# Patient Record
Sex: Female | Born: 1960 | Race: White | Hispanic: No | Marital: Married | Smoking: Former smoker
Health system: Southern US, Community
[De-identification: ages and names within clinical notes are randomized; demographics above are authoritative.]

## PROBLEM LIST (undated history)

## (undated) HISTORY — PX: APPENDECTOMY: SHX54

## (undated) HISTORY — PX: ENDOMETRIAL ABLATION: SHX621

## (undated) HISTORY — PX: TONSILLECTOMY: SUR1361

## (undated) HISTORY — PX: OTHER SURGICAL HISTORY: SHX169

## (undated) HISTORY — PX: CHOLECYSTECTOMY: SHX55

## (undated) HISTORY — PX: LAPAROTOMY: SHX154

---

## 1997-10-06 ENCOUNTER — Ambulatory Visit (HOSPITAL_COMMUNITY): Admission: RE | Admit: 1997-10-06 | Discharge: 1997-10-06 | Payer: Self-pay | Admitting: Gastroenterology

## 1999-08-07 ENCOUNTER — Other Ambulatory Visit: Admission: RE | Admit: 1999-08-07 | Discharge: 1999-08-07 | Payer: Self-pay | Admitting: Obstetrics and Gynecology

## 1999-12-08 ENCOUNTER — Encounter (INDEPENDENT_AMBULATORY_CARE_PROVIDER_SITE_OTHER): Payer: Self-pay | Admitting: Specialist

## 1999-12-08 ENCOUNTER — Ambulatory Visit (HOSPITAL_COMMUNITY): Admission: RE | Admit: 1999-12-08 | Discharge: 1999-12-08 | Payer: Self-pay | Admitting: Family Medicine

## 2000-09-10 ENCOUNTER — Other Ambulatory Visit: Admission: RE | Admit: 2000-09-10 | Discharge: 2000-09-10 | Payer: Self-pay | Admitting: Obstetrics and Gynecology

## 2000-10-01 ENCOUNTER — Encounter: Payer: Self-pay | Admitting: Obstetrics and Gynecology

## 2000-10-01 ENCOUNTER — Ambulatory Visit (HOSPITAL_COMMUNITY): Admission: RE | Admit: 2000-10-01 | Discharge: 2000-10-01 | Payer: Self-pay | Admitting: Obstetrics and Gynecology

## 2001-08-15 ENCOUNTER — Inpatient Hospital Stay (HOSPITAL_COMMUNITY): Admission: AD | Admit: 2001-08-15 | Discharge: 2001-08-15 | Payer: Self-pay | Admitting: Obstetrics and Gynecology

## 2001-09-25 ENCOUNTER — Other Ambulatory Visit: Admission: RE | Admit: 2001-09-25 | Discharge: 2001-09-25 | Payer: Self-pay | Admitting: Obstetrics and Gynecology

## 2002-04-25 ENCOUNTER — Inpatient Hospital Stay (HOSPITAL_COMMUNITY): Admission: AD | Admit: 2002-04-25 | Discharge: 2002-04-30 | Payer: Self-pay | Admitting: Obstetrics and Gynecology

## 2002-05-01 ENCOUNTER — Encounter: Admission: RE | Admit: 2002-05-01 | Discharge: 2002-05-31 | Payer: Self-pay | Admitting: Obstetrics and Gynecology

## 2002-06-01 ENCOUNTER — Encounter: Admission: RE | Admit: 2002-06-01 | Discharge: 2002-07-01 | Payer: Self-pay | Admitting: Obstetrics and Gynecology

## 2002-06-15 ENCOUNTER — Other Ambulatory Visit: Admission: RE | Admit: 2002-06-15 | Discharge: 2002-06-15 | Payer: Self-pay | Admitting: Obstetrics and Gynecology

## 2003-06-28 ENCOUNTER — Other Ambulatory Visit: Admission: RE | Admit: 2003-06-28 | Discharge: 2003-06-28 | Payer: Self-pay | Admitting: Obstetrics and Gynecology

## 2004-09-29 ENCOUNTER — Other Ambulatory Visit: Admission: RE | Admit: 2004-09-29 | Discharge: 2004-09-29 | Payer: Self-pay | Admitting: Obstetrics and Gynecology

## 2005-01-08 ENCOUNTER — Ambulatory Visit (HOSPITAL_COMMUNITY): Admission: RE | Admit: 2005-01-08 | Discharge: 2005-01-08 | Payer: Self-pay | Admitting: Gastroenterology

## 2005-01-08 ENCOUNTER — Encounter (INDEPENDENT_AMBULATORY_CARE_PROVIDER_SITE_OTHER): Payer: Self-pay | Admitting: *Deleted

## 2005-01-09 ENCOUNTER — Ambulatory Visit (HOSPITAL_COMMUNITY): Admission: RE | Admit: 2005-01-09 | Discharge: 2005-01-09 | Payer: Self-pay | Admitting: Gastroenterology

## 2005-12-28 ENCOUNTER — Ambulatory Visit (HOSPITAL_COMMUNITY): Admission: RE | Admit: 2005-12-28 | Discharge: 2005-12-28 | Payer: Self-pay | Admitting: Gastroenterology

## 2005-12-28 ENCOUNTER — Encounter (INDEPENDENT_AMBULATORY_CARE_PROVIDER_SITE_OTHER): Payer: Self-pay | Admitting: Specialist

## 2006-01-07 ENCOUNTER — Ambulatory Visit (HOSPITAL_COMMUNITY): Admission: RE | Admit: 2006-01-07 | Discharge: 2006-01-07 | Payer: Self-pay | Admitting: Gastroenterology

## 2006-01-30 ENCOUNTER — Ambulatory Visit (HOSPITAL_COMMUNITY): Admission: RE | Admit: 2006-01-30 | Discharge: 2006-01-30 | Payer: Self-pay | Admitting: General Surgery

## 2006-03-07 ENCOUNTER — Ambulatory Visit (HOSPITAL_COMMUNITY): Admission: RE | Admit: 2006-03-07 | Discharge: 2006-03-08 | Payer: Self-pay | Admitting: General Surgery

## 2006-03-07 ENCOUNTER — Encounter (INDEPENDENT_AMBULATORY_CARE_PROVIDER_SITE_OTHER): Payer: Self-pay | Admitting: Specialist

## 2006-07-24 ENCOUNTER — Encounter: Payer: Self-pay | Admitting: Emergency Medicine

## 2006-07-25 ENCOUNTER — Inpatient Hospital Stay (HOSPITAL_COMMUNITY): Admission: AD | Admit: 2006-07-25 | Discharge: 2006-07-26 | Payer: Self-pay | Admitting: Obstetrics and Gynecology

## 2006-07-25 ENCOUNTER — Encounter (INDEPENDENT_AMBULATORY_CARE_PROVIDER_SITE_OTHER): Payer: Self-pay | Admitting: Specialist

## 2006-08-01 ENCOUNTER — Inpatient Hospital Stay (HOSPITAL_COMMUNITY): Admission: AD | Admit: 2006-08-01 | Discharge: 2006-08-02 | Payer: Self-pay | Admitting: Obstetrics and Gynecology

## 2007-02-05 ENCOUNTER — Encounter (INDEPENDENT_AMBULATORY_CARE_PROVIDER_SITE_OTHER): Payer: Self-pay | Admitting: Gastroenterology

## 2007-02-05 ENCOUNTER — Ambulatory Visit (HOSPITAL_COMMUNITY): Admission: RE | Admit: 2007-02-05 | Discharge: 2007-02-05 | Payer: Self-pay | Admitting: Gastroenterology

## 2007-04-30 ENCOUNTER — Ambulatory Visit: Payer: Self-pay | Admitting: Psychology

## 2007-05-13 ENCOUNTER — Ambulatory Visit: Payer: Self-pay | Admitting: Psychology

## 2007-05-17 ENCOUNTER — Emergency Department (HOSPITAL_COMMUNITY): Admission: EM | Admit: 2007-05-17 | Discharge: 2007-05-17 | Payer: Self-pay | Admitting: Family Medicine

## 2007-05-19 ENCOUNTER — Ambulatory Visit: Payer: Self-pay | Admitting: Psychology

## 2007-05-25 ENCOUNTER — Emergency Department (HOSPITAL_COMMUNITY): Admission: EM | Admit: 2007-05-25 | Discharge: 2007-05-25 | Payer: Self-pay | Admitting: Emergency Medicine

## 2007-05-30 ENCOUNTER — Ambulatory Visit: Payer: Self-pay | Admitting: Psychology

## 2007-05-31 ENCOUNTER — Ambulatory Visit (HOSPITAL_COMMUNITY): Admission: RE | Admit: 2007-05-31 | Discharge: 2007-05-31 | Payer: Self-pay | Admitting: Orthopaedic Surgery

## 2007-06-17 ENCOUNTER — Ambulatory Visit (HOSPITAL_BASED_OUTPATIENT_CLINIC_OR_DEPARTMENT_OTHER): Admission: RE | Admit: 2007-06-17 | Discharge: 2007-06-17 | Payer: Self-pay | Admitting: Orthopaedic Surgery

## 2007-06-26 ENCOUNTER — Ambulatory Visit: Payer: Self-pay | Admitting: Psychology

## 2007-08-13 ENCOUNTER — Encounter (INDEPENDENT_AMBULATORY_CARE_PROVIDER_SITE_OTHER): Payer: Self-pay | Admitting: Obstetrics and Gynecology

## 2007-08-14 ENCOUNTER — Ambulatory Visit (HOSPITAL_COMMUNITY): Admission: RE | Admit: 2007-08-14 | Discharge: 2007-08-14 | Payer: Self-pay | Admitting: Obstetrics and Gynecology

## 2007-08-21 ENCOUNTER — Ambulatory Visit: Payer: Self-pay | Admitting: Psychology

## 2007-11-30 ENCOUNTER — Emergency Department (HOSPITAL_COMMUNITY): Admission: EM | Admit: 2007-11-30 | Discharge: 2007-11-30 | Payer: Self-pay | Admitting: Family Medicine

## 2007-12-31 ENCOUNTER — Encounter: Admission: RE | Admit: 2007-12-31 | Discharge: 2007-12-31 | Payer: Self-pay | Admitting: Obstetrics and Gynecology

## 2008-01-11 ENCOUNTER — Encounter: Admission: RE | Admit: 2008-01-11 | Discharge: 2008-01-11 | Payer: Self-pay | Admitting: Obstetrics and Gynecology

## 2008-01-13 ENCOUNTER — Ambulatory Visit: Payer: Self-pay | Admitting: Psychology

## 2008-01-23 ENCOUNTER — Ambulatory Visit: Payer: Self-pay | Admitting: Psychology

## 2008-02-10 ENCOUNTER — Ambulatory Visit: Payer: Self-pay | Admitting: Psychology

## 2008-08-10 ENCOUNTER — Encounter: Admission: RE | Admit: 2008-08-10 | Discharge: 2008-08-10 | Payer: Self-pay | Admitting: Endocrinology

## 2010-09-05 NOTE — Op Note (Signed)
NAMEKARLIAH, [name]             ACCOUNT NO.:  [id number]   MEDICAL RECORD NO.:  [id number]          PATIENT TYPE:  AMB   LOCATION:  ENDO                         FACILITY:  [facility]   PHYSICIAN:  Anselmo Rod, M.D.  DATE OF BIRTH:  [birth year]   DATE OF PROCEDURE:  02/05/2007  DATE OF DISCHARGE:                               OPERATIVE REPORT   PROCEDURE PERFORMED:  Colonoscopy with rectal biopsies x 5.   ENDOSCOPIST:  Anselmo Rod, M.D.   INSTRUMENT USED:  Pentax video colonoscope.   INDICATIONS FOR PROCEDURE:  50 year old white female with a history of  ulcerative colitis and recent rectal bleeding undergoing a repeat  colonoscopy to evaluate the extent of disease.   PREPROCEDURE PREPARATION:  Informed consent was procured from the  patient.  The patient fasted for 4 hours prior to the procedure after  being prepped with 32 Osmo prep pills the night prior to the procedure.  The risks and benefits of the procedure including a 10% miss rate of  cancer and polyps were discussed with the patient, as well.   PREPROCEDURE PHYSICAL:  The patient had stable vital signs.  Neck  supple.  Chest clear to auscultation.  S1 and S2 regular.  Abdomen soft  with normal bowel sounds.   DESCRIPTION OF PROCEDURE:  The patient was placed in the left lateral  decubitus position and sedated with 100 mcg of fentanyl and 10 mg of  Versed given intravenously in slow incremental doses.  Once the patient  was adequately sedated and maintained on low flow oxygen and continuous  cardiac monitoring, the Pentax video colonoscope was advanced from the  rectum to cecum.  There was a significant amount of residual stool in  the colon.  Multiple washes were done.  Small lesions could have been  missed.  Mild inflammatory changes were noted from 0 to 8 cm involving  the rectum.  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 The rest of the colonic mucosa beyond 8 cm appeared normal.  The colonic mucosa had a healthy vascular pattern.  The appendiceal  orifice and ileocecal valve were clearly visualized and photographed.  The terminal ileum appeared healthy without lesions.  No other masses or  polyps were seen.  There was no evidence of diverticulosis.  The patient  tolerated the procedure well without any immediate complications.   IMPRESSION:  1. Mild inflammatory changes in the rectum from 0 to 8 cm.  2. Biopsies done to confirm the prior diagnosis of ulcerative      proctitis.  3. Normal colonic mucosa beyond 8 cm of the terminal ileum.   RECOMMENDATIONS:  1. Await pathology results.  2. Avoid all nonsteroidals including aspirin for now.  3. Discontinue Lialda.  4. Use Canasa suppositories 1000 mg p.o. q.h.s.  5. Outpatient follow up in the next three months for re-evaluation of      the patient's symptoms. If the patient has any abnormal symptoms      prior to that, she should contact the office for an earlier      appointment.  Anselmo Rod, M.D.  Electronically Signed     JNM/MEDQ  D:  02/05/2007  T:  02/06/2007  Job:  [id number]   cc:   Thora Lance, M.D.  Fax: [phone]

## 2010-09-05 NOTE — Op Note (Signed)
[name], [name]             ACCOUNT NO.:  [id number]   MEDICAL RECORD NO.:  [id number]          PATIENT TYPE:  AMB   LOCATION:  SDC                           FACILITY:  [facility]   PHYSICIAN:  Guy Sandifer. Henderson Cloud, M.D. DATE OF BIRTH:  [birth year]   DATE OF PROCEDURE:  08/14/2007  DATE OF DISCHARGE:                               OPERATIVE REPORT   PREOPERATIVE DIAGNOSIS:  1. Menorrhagia.  2. Desires permanent sterilization.   POSTOPERATIVE DIAGNOSES:  1. Menorrhagia.  2. Desires permanent sterilization.   PROCEDURE:  NovaSure endometrial ablation, hysteroscopy, dilatation and  curettage, laparoscopy with tubal ligation with Filshie clip  application, and ablation of endometriosis, and paracervical block.   SURGEON:  Guy Sandifer. Henderson Cloud, M.D.   ANESTHESIA:  General with endotracheal intubation.   SPECIMENS:  Endometrial curettings to pathology.   ESTIMATED BLOOD LOSS:  Minimal.   I&OS OF DISTENDING MEDIA:  145 mL deficit, with most of that being on  the floor.   INDICATIONS AND CONSENT:  This patient is a 50 year old married white  female, G1, P1, with increasingly heavy menses.  Details are dictated in  the history and physical.  Hysteroscopy D&C, endometrial ablation,  laparoscopy with tubal ligation have been discussed preoperatively.  Potential risks and complications were discussed preoperatively,  including but not limited to infection, bleeding requiring transfusion  of blood products, with HIV and hepatitis acquisition, uterine  perforation, organ damage, DVT, PE, and pneumonia.  Permanence, failure  rate, and increased ectopic risk of tubal ligation, as well as success  and failure rate of ablation, have also been reviewed.  All questions  were answered, and consent is signed on the chart.   FINDINGS:  The endometrial cavity is without abnormal structure.  Fallopian tube ostia are identified bilaterally.  Abdominally, upper  abdomen is grossly normal.  Appendix has  been removed.  Uterus is about  6 weeks in size, smooth in contour.  Tubes and ovaries are normal  bilaterally.  There is a white puckered implant of endometriosis on the  pelvic sidewalls bilaterally, well above the course of the ureters.  There is a single white-black spot of endometriosis in the left-hand  side of the posterior cul-de-sac.   PROCEDURE:  The patient is taken to the operating room, where she is  identified, placed in dorsal supine position, and general anesthesia is  induced via endotracheal intubation.  She is then placed in the dorsal  lithotomy position, where she is prepped abdominally and vaginally,  bladder straight catheterized, and she is draped in a sterile fashion.  Bivalve speculum is placed in vagina, and the anterior cervical lip is  injected with 1% plain Xylocaine and grasped with a single-tooth  tenaculum.  Paracervical block is placed at the 2, 4, 5, 7, 8, and 10  o'clock positions with approximately 20 mL of the same solution.  The  uterine cavity measures 10 cm, and the cervix sounds to 5 cm.  Cervix is  progressively dilated.  Diagnostic hysteroscope is placed in the  endocervical canal and advanced under direct visualization using  distending media.  The above findings  are noted.  Hysteroscope is  withdrawn, and sharp curettage is carried out.  The NovaSure device is  then placed.  It passed the test on the first attempt.  Endometrial  ablation is then carried out.  The device is then removed and seen to be  intact.  Reinspection with the hysteroscope which is again advanced  under direct visualization with distending media reveals a good cautery  effect throughout the uterus up into the cornua bilaterally.  No  evidence of perforation is noted.  The hysteroscope is then withdrawn,  and the single-tooth tenaculum is replaced with a Hulka tenaculum.  Attention is turned the abdomen.  The infraumbilical and suprapubic  areas are injected in the  midline with 0.5% plain Marcaine.  A small  infraumbilical incision is made, and a disposable Veress needle is  placed without difficulty.  A normal syringe and drop test are noted.  2  liters of gas were insufflated under low pressure, with good tympany in  the right upper quadrant.  Veress needle is then removed, and a 10/11  Xcel bladeless disposable trocar sleeve os placed using direct  visualization with the diagnostic laparoscopic.  After placement, the  operative laparoscope is placed.  A small suprapubic incision is made in  the midline over the old Pfannenstiel scar, and a 5 mm Xcel bladeless  disposable trocar sleeve is placed under direct visualization.  The  above findings are noted.  Bipolar cautery is used to ablate the areas  of endometriosis described above.  There were also some very filmy  adhesions in the posterior cul-de-sac which came down very easily.  This  was clear of the course of the ureters bilaterally.  The right fallopian  tube is identified from cornu to fimbria.  It is held up with a grasper.  Filshie clip is placed on the proximal one-third of the tube.  Similar  procedure is carried out on the left side.  After the applicator has  been removed, reinspection reveals the entire width of the fallopian  tube to be within the Filshie clip.  The heel of the clip can be seen  through the mesosalpinx.  Good hemostasis is noted all around.  All  instruments are removed, and all counts were correct.  Pneumoperitoneum  is reduced, and the trocar sleeves are removed as well.  A single 2-0  Vicryl suture is used to reapproximate the suprapubic incision.  Dermabond is placed on both incisions.  Hulka tenaculum is removed, and  no bleeding is noted.  All counts are correct.  The patient is awakened  and taken to the recovery room in stable condition.      Guy Sandifer Henderson Cloud, M.D.  Electronically Signed     JET/MEDQ  D:  08/14/2007  T:  08/14/2007  Job:  [id number]

## 2010-09-05 NOTE — Op Note (Signed)
NAMESAYAKA, [name]             ACCOUNT NO.:  [id number]   MEDICAL RECORD NO.:  [id number]          PATIENT TYPE:  AMB   LOCATION:  DSC                          FACILITY:  [facility]   PHYSICIAN:  Lubertha Basque. Dalldorf, M.D.DATE OF BIRTH:  [birth year]   DATE OF PROCEDURE:  06/17/2007  DATE OF DISCHARGE:                               OPERATIVE REPORT   PREOPERATIVE DIAGNOSIS:  Left knee osteochondral defect.   POSTOPERATIVE DIAGNOSIS:  Left knee osteochondral defect.   PROCEDURE:  1. Left knee removal, loose body.  2. Left knee microfracture, lateral femoral condyle.   ANESTHESIA:  General.   ATTENDING SURGEON:  Lubertha Basque. Jerl Santos, M.D.   ASSISTANT:  None.   INDICATIONS FOR PROCEDURE:  The patient is a 50 year old woman with many  weeks of intense left knee pain and swelling.  This has persisted  despite oral anti-inflammatories and activity restriction.  By MRI, she  has an osteochondral lesion in the lateral compartment but meniscal  structures are intact.  She has mechanical locking in her knee and is  offered an arthroscopy.  Informed operative consent was obtained after a  discussion of possible complications of reaction to anesthesia and  infection.   SUMMARY OF FINDINGS AND PROCEDURE:  Under general anesthesia, an  arthroscopy of the left knee was performed.  The suprapatellar pouch was  benign as was the patellofemoral joint.  The medial compartment  exhibited no evidence of meniscal or articular cartilage injury.  The  ACL was intact.  In the lateral compartment, she had a quarter sized  area of bare bone on the lateral femoral condyle in the weight bearing  portion of this structure.  She had some loose margins of articular  cartilage and a chondroplasty was done back to stable margins.  She also  had some cartilaginous loose bodies which obviously emanated from this  area and these were removed.  We then performed microfracture to  bleeding bone.   DESCRIPTION OF  PROCEDURE:  The patient was taken to the operating suite  where a general anesthetic was applied without difficulty.  She was  positioned supine and prepped and draped in a normal sterile fashion.  After the administration of IV Kefzol, an arthroscopy of the left knee  was performed through a total of two portals.  Findings were as noted  above and the procedure consisted of removal of the cartilaginous loose  bodies, chondroplasty, and microfracture using the awls.  I decreased  the pump pressure and she bled well from all five of these small  microfracture sites in the defect.  The knee was thoroughly irrigated  followed by placement of Marcaine with epinephrine and morphine.  Adaptic was placed over the portals followed by dry gauze and a loose  Ace wrap.  Estimated blood loss and intraoperative fluids can be  obtained from anesthesia records.   DISPOSITION:  The patient was extubated in the operating room and taken  to the recovery room in stable condition.  She was to go home same day  and follow up in the office in less than a week.  I will  contact her by  phone tonight.      Lubertha Basque Jerl Santos, M.D.  Electronically Signed     PGD/MEDQ  D:  06/17/2007  T:  06/17/2007  Job:  [id number]

## 2010-09-05 NOTE — H&P (Signed)
[name], [name]             ACCOUNT NO.:  [id number]   MEDICAL RECORD NO.:  [id number]          PATIENT TYPE:  AMB   LOCATION:  SDC                           FACILITY:  [facility]   PHYSICIAN:  Guy Sandifer. Henderson Cloud, M.D. DATE OF BIRTH:  Jun 03, 1960   DATE OF ADMISSION:  DATE OF DISCHARGE:                              HISTORY & PHYSICAL   CHIEF COMPLAINT:  Heavy menses.   HISTORY OF PRESENT ILLNESS:  This patient is a 50 year old married white  female G1, P1 with increasingly heavy menses.  Now last upwards of 10  days.  Three to four days will be heavy bleeding, changing a tampon  every 2 hours.  Ultrasound in my office on July 08, 2007 revealed a  uterus measuring 10.8 x 5.1 x 6.6 cm.  Sonohysterogram was negative for  intracavitary masses.  Ovaries were within normal limits.  After  discussion of options, she is being admitted for NovaSure endometrial  ablation as well as laparoscopy with bilateral tubal ligation.  Risks  and benefits have been discussed.   PAST MEDICAL HISTORY:  Depression.   PAST SURGICAL HISTORY:  1. Tonsillectomy 1968.  2. Laparoscopies for endometriosis 1992.  3. Laparotomy with right ovarian cystectomy for endometriosis 1997.  4. Laparoscopy with ablation of endometriosis and appendectomy in      2008.  5. Arthroscopy.  6. Blepharoplasty.  7. Cholecystectomy.   MEDICATIONS:  Lexapro, Wellbutrin and trazodone p.r.n.   No known drug allergies.   SOCIAL HISTORY:  Denies tobacco, alcohol, or drug abuse.   FAMILY HISTORY:  Positive for CVA in grandmother.   OBSTETRICAL HISTORY:  Delivery x1.   REVIEW OF SYSTEMS:  NEUROLOGICAL:  Denies headache.  CARDIAC:  Denies  chest pain.  PULMONARY:  Denies shortness of breath   PHYSICAL EXAMINATION:  Height 5 feet 7 inches, weight 168 pounds, blood  pressure 110/68.  HEENT:  Without thyromegaly.  LUNGS:  Clear to auscultation.  HEART:  Regular rate and rhythm.  BACK:  Without CVA tenderness.  ABDOMEN:  Soft,  nontender without masses.  PELVIC EXAM:  Vulva, vagina, cervix without lesion.  Uterus is upper  normal, mobile, nontender.  Adnexa nontender without masses.  EXTREMITIES:  Grossly within normal limits.  NEUROLOGICAL EXAM:  Grossly within normal limits.   ASSESSMENT:  Menorrhagia, desires permanent sterilization.   PLAN:  Laparoscopy with bilateral tubal ligation and hysteroscopy,  dilatation curettage and NovaSure endometrial ablation.      Guy Sandifer Henderson Cloud, M.D.  Electronically Signed     JET/MEDQ  D:  08/11/2007  T:  08/11/2007  Job:  [id number]

## 2010-09-08 NOTE — Op Note (Signed)
NAMEZAYDEN, [name]             ACCOUNT NO.:  [id number]   MEDICAL RECORD NO.:  [id number]          PATIENT TYPE:  AMB   LOCATION:  ENDO                         FACILITY:  [facility]   PHYSICIAN:  Anselmo Rod, M.D.  DATE OF BIRTH:  [birth year]   DATE OF PROCEDURE:  12/28/2005  DATE OF DISCHARGE:                                 OPERATIVE REPORT   PROCEDURE:  Esophagogastroduodenoscopy with antral biopsies.   ENDOSCOPIST:  Anselmo Rod, M.D.   INSTRUMENTATION:  Olympus video panendoscope.   INDICATIONS FOR PROCEDURE:  This is a 50 year old white female with a  history of epigastric pain not responding to PPIs.  The patient has nausea  associated with this.  Rule out peptic ulcer disease, esophagitis,  gastritis, etc.   PREPROCEDURE PREPARATION:  Informed consent was obtained from the patient  and the patient fasted for 8 hours prior to the procedure.  Risks and  benefits of the procedure were discussed with the patient in great detail.   PREPROCEDURE PHYSICAL EXAMINATION:  VITAL SIGNS:  Stable.  NECK:  Supple.  CHEST:  Clear to auscultation.  HEART:  S1 and S2, regular.  ABDOMEN:  Soft with normal bowel sounds.   DESCRIPTION OF PROCEDURE:  The patient was placed in the left lateral  decubitus position, sedated with 75 mcg of fentanyl and 7.5 mg of Versed in  incremental doses.  Once the patient was adequately sedated and maintained  on low flow oxygen and continuous cardiac monitoring, the Olympus video  panendoscope was advanced through the mouthpiece, over the tongue, into the  esophagus under direct vision.  The entire esophagus was widely patent with  no evidence of ring, stricture, masses, esophagitis or Barrett's mucosa.  Scope was then advanced into the stomach.  Mild diffuse gastritis was noted  with a few antral erosions.  Biopsies were done from this area to rule out  presence of H. pylori by pathology.  No masses or polyps were identified.  Retroflexion in the  high cardia revealed no evidence of a hiatal hernia.  The proximal small bowel appeared normal.  There was no outlet obstruction.  The patient tolerated the procedure well without immediate complications.   IMPRESSION:  1.Normal-appearing esophagus with no esophagitis, Barrett's  mucosa, stricture, etc.  2.Diffuse gastritis with antral erosions biopsied for H. Pylori.  3.No ulcers, masses or polyps seen.  4.Normal proximal small bowel.   RECOMMENDATIONS:  1.Await pathology results.  2.Continue proton pump inhibitors.  3.Treat with antibiotics if H. pylori present on biopsy.  4.Outpatient followup in the next two weeks for further recommendations.      Anselmo Rod, M.D.  Electronically Signed     JNM/MEDQ  D:  12/28/2005  T:  12/28/2005  Job:  [id number]   cc:   Thora Lance, M.D.

## 2010-09-08 NOTE — Op Note (Signed)
NAME:  [name], [name]                       ACCOUNT NO.:  [id number]   MEDICAL RECORD NO.:  [id number]                   PATIENT TYPE:  INP   LOCATION:  9133                                 FACILITY:  [facility]   PHYSICIAN:  Juluis Mire, M.D.                DATE OF BIRTH:  1961-04-06   DATE OF PROCEDURE:  04/25/2002  DATE OF DISCHARGE:                                 OPERATIVE REPORT   PREOPERATIVE DIAGNOSIS:  Intrauterine pregnancy at 41 weeks with failed  induction.   POSTOPERATIVE DIAGNOSIS:  1. Intrauterine pregnancy at 39-3/7 weeks with failed induction.   PROCEDURE:  Low transverse cesarean section.   SURGEON:  Guy Sandifer. Henderson Cloud, M.D.   ANESTHESIA:  Epidural.   ESTIMATED BLOOD LOSS:  800 cc.   PACKS AND DRAINS:  None.   INTRAOPERATIVE BLOOD REPLACED:  None.   COMPLICATIONS:  None.   INDICATIONS:  This is a 50 year old white female who presents at 41+ weeks  for induction of labor.  After Cytotec the evening before and going through  Pitocin the next day, no progression of cervical change was noted.  The  cervix remained long and closed.  The patient was given the option of a  second day of induction.  She opted out for a primary cesarean section.  The  risks were discussed including the risks of infection, the risks of  hemorrhage which could necessitate transfusion with the risks of HIV or  hepatitis, risks of injury to adjacent organs which could require further  exploratory surgery, the risks of deep venous thrombosis and pulmonary  embolus.   DESCRIPTION OF PROCEDURE:  The patient was taken to the operating room and  placed in the supine position with a left lateral tilt.  After a  satisfactory level of epidural anesthesia was obtained, the abdomen was  prepped out with Betadine and draped as a sterile field.  A prior low  transverse skin incision was excised and then the incision was extended  through subcutaneous tissue.  The fascia was entered sharply and  the  incision to the fascia was extended laterally.  The fascia was taken off the  muscle superiorly and inferiorly.  Rectus muscles were separated in the  midline.  The anterior peritoneum was entered sharply.  Incision in the  peritoneum was extended both superiorly and inferiorly.  A low transverse  bladder flap was developed.  A low transverse uterine incision was begun  with the knife and extended laterally using manual traction.  The infant was  delivered with elevation of the head and fundal pressure, and it was a  viable female weighing 8 pounds 15 ounces, Apgars 8 and 9.  Umbilical cord  pH was 7.31.  The placenta was then delivered manually.  The uterus was  wiped free of remaining membranes and placenta.  The uterus was closed with  a running locking suture of 0 chromic using the  two layer closure technique.  The uterus had been exteriorized for closure.  Tubes and ovaries were  unremarkable.  She had previous surgery for endometriosis.  No adhesions  were noted.  The uterus was then returned to the abdominal cavity.  The  pelvic cavity was thoroughly irrigated.  We had excellent hemostasis at the  uterine incision.  Urine output remained clear and adequate.  The muscles  and peritoneum were closed with a running suture of 3-0 Vicryl.  The fascia  was closed with a running suture of 0 PDS.  The skin was closed with staples  and Steri-Strips.  Sponge, instrument and needle counts were reported  correct by the circulating nurse x2.  Foley catheter remained clear at the  time of closure.  The patient tolerated the procedure well and was returned  to the recovery room in good condition.                                                Juluis Mire, M.D.    JSM/MEDQ  D:  04/26/2002  T:  04/26/2002  Job:  [id number]

## 2010-09-08 NOTE — Discharge Summary (Signed)
NAME:  [name], [name]                       ACCOUNT NO.:  [id number]   MEDICAL RECORD NO.:  [id number]                   PATIENT TYPE:  INP   LOCATION:  9133                                 FACILITY:  [facility]   PHYSICIAN:  Tracie Harrier, M.D.              DATE OF BIRTH:  [birth year]   DATE OF ADMISSION:  04/25/2002  DATE OF DISCHARGE:  04/30/2002                                 DISCHARGE SUMMARY   ADMISSION DIAGNOSES:  1. Intrauterine pregnancy at 29 weeks estimated gestational age.  2. Induction of labor.   DISCHARGE DIAGNOSES:  1. Status post low transverse cesarean section secondary to failed     induction.  2. Viable female infant.   PROCEDURE:  Primary low transverse cesarean section.   REASON FOR ADMISSION:  Please see dictated H&P.   HOSPITAL COURSE:  The patient is a 50 year old white female who presents to  [facility] at 41 weeks estimated gestational age for  induction of labor.  On the evening of her admission, Cytotec was  administered intravaginally.  On the following morning, Pitocin was given  with no progression of cervical change.  Cervix remained long and closed.  On the second day of her induction, option was given for the patient to  continue with Pitocin induction or to proceed with primary low transverse  cesarean section.  The patient and husband agreed to proceed with a low  transverse cesarean delivery.  Risks were discussed with the patient and the  patient was prepped accordingly and taken to the operating room where  epidural anesthesia was adequately based to a surgical level.  A low  transverse incision was made with delivery of a viable female infant  weighing 8 pounds 15 ounces with Apgars of 8 at one minute and 9 at five  minutes.  Umbilical cord pH was 7.31.  The patient tolerated the procedure  well and was taken to the recovery room in stable condition.   On postoperative day #1, the patient had good return of bowel  function.  Abdominal dressing was clean, dry and intact.  The patient was tolerating  clear liquid diet without complaints of nausea and vomiting.  Labs revealed  hemoglobin of 10.4, platelet count of 146,000, WBC count of 12.1.  On  postoperative day #2, the patient was without complaints.  Abdominal  dressing had been removed revealing an incision that was clean, dry and  intact.  The patient was ambulating well without assistance.   On postoperative day #3, the patient complained of some tenderness in the  left margin of her incision.  The incision was clean, dry and intact without  evidence of ecchymosis or erythema.  Staples are removed.  The decision was  made to hold discharge due to baby with episode of bradycardia.   On postoperative day #4 the patient was without complaint.  The baby was  stable.  Fundus is firm and  nontender. Incision was clean, dry and intact  and the patient was discharged home.   CONDITION ON DISCHARGE:  Good.   DIET:  Regular as tolerated.   ACTIVITY:  No heavy lifting, no driving x2 weeks.  No vaginal entry.   FOLLOW UP:  The patient is to follow up in the office in one to two weeks  for an incision check.  She is to call for temperature greater than 100  degrees or persistent nausea and vomiting, heavy vaginal bleeding, and/or  redness or drainage from the incision site.   DISCHARGE MEDICATIONS:  1. Percocet 5/325 mg #30 one p.o. q.4-6h. p.r.n. pain.  2. Motrin 600 mg one p.o. every six hours.  3. Prenatal vitamins one p.o. daily.  4. Colace one p.o. daily p.r.n.     Julio Sicks, N.P.                        Tracie Harrier, M.D.    CC/MEDQ  D:  06/04/2002  T:  06/04/2002  Job:  [id number]

## 2010-09-08 NOTE — Discharge Summary (Signed)
NAMEMARJO, [name]             ACCOUNT NO.:  [id number]   MEDICAL RECORD NO.:  [id number]          PATIENT TYPE:  INP   LOCATION:  9315                          FACILITY:  [facility]   PHYSICIAN:  Guy Sandifer. Henderson Cloud, M.D. DATE OF BIRTH:  Jun 27, 1960   DATE OF ADMISSION:  07/25/2006  DATE OF DISCHARGE:  07/26/2006                               DISCHARGE SUMMARY   ADMITTING DIAGNOSIS:  Pelvic pain.   DISCHARGE DIAGNOSIS:  Pelvic pain.   PROCEDURE:  On July 25, 2006, laparoscopy ablation of endometriosis,  aspiration of right ovarian cyst, and laparoscopic appendectomy per Dr.  Laurence Aly.   REASON FOR ADMISSION:  This patient is a 50 year old married white  female who was evaluated in the [facility] Emergency Room by  Dr. Claud Kelp for a sudden onset of central pelvic and lower  abdominal pain at about 7:30 p.m. on July 24, 2006.  The pain was quite  severe at times.  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  She was afebrile.  She was  transferred to [facility] for further evaluation.  She remained  afebrile with stable vital signs.  She received 1 dose of Zosyn IV and  was then placed on Unasyn IV 3 g q.6 h.  Ultrasound at [facility]  revealed a normal uterus and right ovary with a dominant follicle.  Left  ovary was not seen well.  There was no pelvic fluid noted.  A small  cystic structure in the posterior pelvis which was noted on CT was not  visualized on ultrasound.  The patient continued to have significant  pain requiring PCA pain medication.  A repeat CBC revealed a white count  of 7.6.  Because of her continued pain, laparoscopy and possible  laparotomy was discussed with the patient.   HOSPITAL COURSE:  The patient was taken to the operating room, underwent  the above procedure.  There was some purulent material noted in the  pelvis and the appendix that was suspicious for appendicitis.  The  pelvic fluid was aspirated and sent for Gram stain and culture.  There  was a 2.5 cm translucent cyst of the right ovary that was aspirated and  the fluid sent to cytology and 2-3 small implants of endometriosis that  were also ablated.  Appendectomy was performed by Dr. Laurence Aly.  On the  day of discharge, she feels like a new woman.  Vital signs are stable.  She remains afebrile.  Abdomen is soft and nontender with normal bowel  sounds.  She is ambulating, tolerating regular diet, and voiding well.   CONDITION ON DISCHARGE:  Good.   DIET:  Regular as tolerated.   ACTIVITY:  No lifting, no operation of automobiles, no vaginal entry.  She is to call the office for problems including but not limited to  temperature of 101 degrees; persistent nausea, vomiting, increasing  pain.   MEDICATIONS:  1. Augmentin 875 mg #14, 1 p.o. b.i.d.  2. Percocet 5/325 mg #40 1-2 p.o. q.6 h p.r.n.  3. Ibuprofen 600 mg q.6 h  p.r.n.   FOLLOW-UP:  In 2 weeks in my office and with Dr. Laurence Aly as directed.      Guy Sandifer Henderson Cloud, M.D.  Electronically Signed     JET/MEDQ  D:  07/26/2006  T:  07/26/2006  Job:  [id number]   cc:   Dr. Laurence Aly

## 2010-09-08 NOTE — Op Note (Signed)
[name], [name]             ACCOUNT NO.:  [id number]   MEDICAL RECORD NO.:  [id number]          PATIENT TYPE:  AMB   LOCATION:  DAY                          FACILITY:  [facility]   PHYSICIAN:  Adolph Pollack, M.D.DATE OF BIRTH:  02-11-1961   DATE OF PROCEDURE:  03/07/2006  DATE OF DISCHARGE:                                 OPERATIVE REPORT   PREOPERATIVE DIAGNOSIS:  Biliary dyskinesia.   POSTOPERATIVE DIAGNOSIS:  Biliary dyskinesia.   PROCEDURE:  Laparoscopic cholecystectomy with interoperative cholangiogram.   SURGEON:  Adolph Pollack, M.D.   ANESTHESIA:  General.   INDICATIONS:  Ms. [name] is a 50 year old female who has fairly classic  biliary colic type symptoms.  However,ultrasounds have been normal for  gallstones.  Upper GI and small bowel series was unremarkable.  Nuclear  medicine hepatobiliary scan was performed which demonstrates a depressed  ejection fraction.  I discussed with her performing a laparoscopic  cholecystectomy.  We discussed the procedure risks and success rate.  She  understands and wanted to proceed.   TECHNIQUE:  She is seen in the holding area and brought to the operating  room and placed supine on the operating table and a general anesthetic was  administered.  The abdominal wall was sterilely prepped and draped.  Dilute  Marcaine was infiltrated in the subumbilical region.  A previous transverse  subumbilical incision scar was reincised and carried through the  subcutaneous tissue until the fascia was identified.  A small incision was  made in the midline fascia and peritoneum entering the peritoneal cavity  under direct vision.  A pursestring suture of 0 Vicryl placed around the  fascial edges.  A Hassan trocar was introduced into the peritoneal cavity.  Pneumoperitoneum was created by insufflation of CO2 gas.  The laparoscope  was then introduced.   The patient was placed in the appropriate position.  An 11-mm trocar was  placed  through the epigastric incision and two 5 mm trocars were placed in  right upper quadrant.  The fundus of the gallbladder was grasped.  There was  some thin adhesions between the body of the gallbladder and the duodenum  which were divided sharply and the duodenum dropped away from the operative  field.  The fundus was retracted to the right shoulder and the infundibulum  grasped and retracted laterally.  Using careful blunt dissection, I  mobilized the infundibulum and isolated the cystic duct and created a window  around it.  A clip was placed in the cystic duct gallbladder junction and  incision made in the cystic duct.  A cholangiocatheter was passed through  the anterior abdominal wall and placed into the cystic duct and the  cholangiogram was performed.   Under real time fluoroscopy, dilute contrast was injected into the cystic  duct which was of moderate length.  The common hepatic, right and left  hepatic, and common bile ducts all filled and contrast drained into the  duodenum.  I did not see any obvious obstructing lesions to suggest common  bile duct stones.   The cholangiocatheter was then removed, the cystic duct was  clipped three  times proximally and divided.  I identified an anterior branch of the cystic  artery, clipped and divided it.  A posterior branch was clipped and divided.  The gallbladder was then dissected free from the liver bed intact using  electrocautery and placed in an Endopouch bag.   The gallbladder fossa was irrigated and inspected and hemostasis was  adequate.  No bile leak was noted.  The irrigation fluid was evacuated.  The  gallbladder was then removed through the subumbilical incision and a  subumbilical fascial defect closed under laparoscopic vision by tightening  up and tying down the pursestring suture.  The remaining trocars were  removed and the pneumoperitoneum was released.  The skin incisions were  closed with 4-0 Monocryl subcuticular  stitches followed by Steri-Strips and  sterile dressings.  She tolerated the procedure well without any apparent  complications and was taken to the recovery room in satisfactory condition.      Adolph Pollack, M.D.  Electronically Signed     TJR/MEDQ  D:  03/07/2006  T:  03/07/2006  Job:  [id number]   cc:   Anselmo Rod, M.D.  Fax: [phone]   Thora Lance, M.D.  Fax: [phone]

## 2010-09-08 NOTE — Op Note (Signed)
NAMESHALEY, LEAVENS             ACCOUNT NO.:  [id number]   MEDICAL RECORD NO.:  [id number]          PATIENT TYPE:  INP   LOCATION:  9315                          FACILITY:  [facility]   PHYSICIAN:  Thomas A. Cornett, M.D.DATE OF BIRTH:  [birth year]   DATE OF PROCEDURE:  07/25/2006  DATE OF DISCHARGE:                               OPERATIVE REPORT   PREOPERATIVE DIAGNOSIS:  Abdominal pain.   POSTOPERATIVE DIAGNOSIS:  Abdominal pain.   PROCEDURE:  Laparoscopic appendectomy.   SURGEON:  Maisie Fus A. Cornett, M.D.   ASSISTANT:  Guy Sandifer. Henderson Cloud, M.D.   ESTIMATED BLOOD LOSS:  10 cc.   SPECIMENS:  Appendix with mass, tip to pathology.   INDICATIONS FOR PROCEDURE:  Patient is a 50 year old female seen by Dr.  Claud Kelp this morning for abdominal pain.  He consulted Dr.  Henderson Cloud, and he actually has brought her to the operating room this  evening for a diagnosed laparoscopy due to abdominal pain.  She also had  a leukocytosis.  I was called intraoperatively to see the patient  because her appendix had atypical appearance to it.  He had examined her  pelvic organs and saw some pus in the abdomen but no obvious origin of  this pus and asked me to come in intraoperatively to look at her  appendix.  Upon review of her appendix, there appeared to be a mass at  the very tip of the appendix.  The appendix was turned over on itself.  There was some dilatation of the appendix, and this to me was abnormal,  and I felt that appendectomy in this setting was warranted, given her  presentation of pain and findings.  There is no obvious evidence of  perforation, but it did not appear normal either.   DESCRIPTION OF PROCEDURE:  At this point in the case, Dr. Henderson Cloud had a  periumbilical 1011 port and another 5 mm port.  I added a second 5 mm  port in the left lower quadrant.  I then used the harmonic scalpel to  take the mesoappendix down with good hemostasis.  Once the mesoappendix  was taken down,  I used the 5 mm scope, placed a GIA-45 stapling device  across the base of the appendix, fired it without difficulty, and  amputated the specimen.  The appendix was then placed in an EndoCatch  back and pulled out through the port site where the 12 mm trocar was.  The 12 was placed in order to accommodate her stapling device.  I then  replaced the 12 mm port and replaced the camera.  Irrigation was used,  and the appendiceal stump appeared hemostatic, and the mesoappendix  appeared hemostatic.  I placed a small piece of Surgicel across the  staple line for hemostasis and  across the mesoappendix.  Irrigation was suctioned out.  At this point  in time, Dr. Henderson Cloud completed the case, as the ports were removed by  him and the port sites were closed by him.   Please see his operative note fore details at this point in time.      Thomas A.  Cornett, M.D.  Electronically Signed     TAC/MEDQ  D:  07/25/2006  T:  07/25/2006  Job:  [id number]

## 2010-09-08 NOTE — Op Note (Signed)
NAMEIRMA, ROULHAC             ACCOUNT NO.:  [id number]   MEDICAL RECORD NO.:  [id number]          PATIENT TYPE:  AMB   LOCATION:  ENDO                         FACILITY:  [facility]   PHYSICIAN:  Anselmo Rod, M.D.  DATE OF BIRTH:  1961/01/24   DATE OF PROCEDURE:  01/08/2005  DATE OF DISCHARGE:                                 OPERATIVE REPORT   PROCEDURE PERFORMED:  Colonoscopy with rectal biopsies.   ENDOSCOPIST:  Anselmo Rod, MD.   INSTRUMENT USED:  Olympus video colonoscope.   INDICATIONS FOR PROCEDURE:  A 50 year old white female with a history of  rectal bleeding.  Patient had a colonoscopy in the past when a small patch  of erythema was biopsied consistent with colitis and therefore repeat  colonoscopy is being done to rule out any evidence of colitis and to further  evaluate her iron deficiency anemia.   PRE-PROCEDURE PREPARATION:  Informed consent was procured from the patient.  The patient was fasted for eight hours prior to the procedure and prepped  with a bottle of magnesium citrate and a gallon of GoLYTELY the night prior  to the procedure.  Risks and benefits of the procedure including a 10% miss  rate of cancer and polyps was discussed with her as well.   PRE-PROCEDURE PHYSICAL:  VITAL SIGNS:  Stable.  NECK:  Supple.  CHEST:  Clear to auscultation.  S1 and S2 regular.  ABDOMEN:  Soft with normal bowel sounds.   DESCRIPTION OF PROCEDURE:  The patient was placed in the left lateral  decubitus position and sedated with 100 mg of Demerol and 10 mg of Versed in  slow incremental doses.  Once the patient was adequately sedated and  maintained on low flow oxygen and continuous cardiac monitoring, the Olympus  video colonoscope was advanced into rectum to the cecum with difficulty.  The patient's position was changed from the left lateral to supine, then the  right lateral position, then to prone position, and then back to the left  lateral position.  With gentle  application of abdominal pressure in several  locations, we reached the cecal base.  The appendiceal orifices and  ileocecal valve were clearly visualized and photographed.  The terminal  ileum appeared normal without lesions.  Mild mucosal inflammation was noted  from 0 to 5 cm.  Biopsies were done.   IMPRESSION:  Essentially normal colonoscopy up to the terminal ileum except  for mild mucosal inflammation noted from 0 to 5 cm; multiple biopsies done;  results pending.   RECOMMENDATIONS:  1.  Await pathology results.  2.  Schedule an upper GI and small bowel follow through.  3.  Outpatient followup in the next two weeks for further recommendations.      Anselmo Rod, M.D.  Electronically Signed     JNM/MEDQ  D:  01/08/2005  T:  01/08/2005  Job:  [id number]   cc:   Guy Sandifer. Henderson Cloud, M.D.  Fax: [phone]

## 2010-09-08 NOTE — Op Note (Signed)
NAMEJOANNAH, GITLIN             ACCOUNT NO.:  [id number]   MEDICAL RECORD NO.:  [id number]          PATIENT TYPE:  INP   LOCATION:  9315                          FACILITY:  [facility]   PHYSICIAN:  Guy Sandifer. Henderson Cloud, M.D. DATE OF BIRTH:  Sep 05, 1960   DATE OF PROCEDURE:  07/25/2006  DATE OF DISCHARGE:                               OPERATIVE REPORT   PREOPERATIVE DIAGNOSIS:  Pelvic pain.   POSTOPERATIVE DIAGNOSIS:  Endometriosis, right ovarian cyst.   PROCEDURE:  Laparoscopy with ablation of endometriosis and aspiration of  right ovarian cyst.   SURGEON:  Guy Sandifer. Henderson Cloud, M.D.   ANESTHESIA:  General endotracheal intubation.  Mal Amabile, M.D.   SPECIMENS:  1. Aspirated right ovarian cyst to cytology.  2. Pelvic fluid aspirate to microbiology for culture and gram stain.   ESTIMATED BLOOD LOSS:  Drops   INDICATIONS:  The patient is a 50 year old married white female with a  history of endometriosis who was admitted last evening with sudden onset  of the midline lower abdominal pelvic pain.  She has a history of  endometriosis and is status post right ovarian cystectomy for  endometriomas.  On presentation she had white count of 16,000.  A CT was  remarkable for a prominent appendix.  She was evaluated by Angelia Mould.  Derrell Lolling, M.D. of general surgery who felt appendicitis unlikely and  transferred her to our service at [facility].  She was placed on  Unasyn and remained afebrile with stable vital signs.  Her central  pelvic and low abdominal pain continued requiring PCA narcotics.  A  repeat white count the day of surgery was 7000 with a hemoglobin of  approximately 9.5.  The patient is also status post a GI evaluation  within the last year for anemia which apparently was benign workup.  Ultrasound on the day of surgery revealed a 2.5 cm follicular cyst on  the right ovary and no free fluid.  The patient continued to have  significant pain.  Therefore, laparoscopy, possible  laparotomy was  discussed with the patient.  Potential risks and complications were  discussed preoperatively including but not limited to infection, bowel,  bladder, ureteral damage, bleeding requiring transfusion of blood  products with possible transfusion reaction, HIV and hepatitis  acquisition, DVT, PE, pneumonia.  All questions were answered and  consent signed on the chart.   FINDINGS:  Upper abdomen is grossly normal.  The appendix is tortuous  and doubled over in an S fashion on the distal one-third.  The pelvis  contains approximately 30 mL of yellow/green purulent-looking fluid.  After aspiration of the fluid the fallopian tubes bilaterally appeared  normal.  The fimbriae were luxuriant and there was no purulent discharge  from the fallopian tubes.  The right ovary contains 2.5 cm translucent  cyst which was aspirated for straw-colored serous fluid and sent to  cytology.  There is a dark black implant of endometriosis on the center  of the vesicouterine peritoneum and in the center the posterior cul-de-  sac well above the reflection of the rectum.   PROCEDURE:  The patient is taken  to operating room where she is  identified, placed in dorsosupine position and general anesthesia is  induced via endotracheal intubation.  She is placed in dorsal lithotomy  position, prepped abdominally and vaginally, bladder straight  catheterized.  Hulka tenaculum was placed in uterus as manipulator and  she is draped in sterile fashion.  The infraumbilical and suprapubic  areas injected the midline with 0.5% plain Marcaine.  An infraumbilical  incision is made and a disposable Veress needle was placed on first  attempt without difficulty.  Syringe and drop test are normal.  2 liters  of gas were insufflated under low pressure with good tympany in the  right upper quadrant.  Veress needle is removed and a 10/11 XL bladeless  disposable trocar sleeve is placed using the diagnostic laparoscope  to  directly visualize.  After placement, this is replaced with the  operative laparoscopic.  A small suprapubic incision was made in the  midline and a 5-mm XL bladeless disposable trocar sleeve is placed under  direct visualization without difficulty.  The above findings were noted.  The fluid in the pelvis was aspirated and sent for culture and gram  stain.  The pelvis was irrigated out.  The cyst on the right ovary is  aspirated for straw-colored serous fluid.  Good hemostasis is noted.  The endometriosis on the vesicouterine peritoneum in center of posterior  cul-de-sac is ablated with bipolar cautery.  Dr. Laurence Aly had been called  to examine the patient's appendix.  At this point the procedure the  patient is stable.  All counts are correct.  Dr. Laurence Aly then began his  appendectomy which is dictated under a separate note.  At the completion  of this appendectomy he leaves the room and the inferior trocar sleeves  were removed and no bleeding is noted.  Pneumoperitoneum is completely  reduced and the umbilical trocar sleeve was removed as well.  The fascia  of the umbilical incision is closed with a 0 Vicryl suture with care  being taken not to pick up any underlying structures.  2-0 Vicryl suture  is used to reapproximate the skin on the umbilical and left lower  quadrant incisions.  Dermabond was placed in all three incisions.  The  Hulka tenaculum was removed and no bleeding is noted.  All counts  correct.  The patient is awakened, taken to recovery room in stable  condition.      Guy Sandifer Henderson Cloud, M.D.  Electronically Signed     JET/MEDQ  D:  07/25/2006  T:  07/25/2006  Job:  [id number]

## 2010-09-08 NOTE — Procedures (Signed)
[place]  Patient:    [name], [name]                    MRN: [id number] Proc. Date: 12/08/99 Adm. Date:  [id number] Attending:  Charna Elizabeth CC:         Elana Alm. Eliezer Lofts., M.D.  Guy Sandifer Arleta Creek, M.D.   Procedure Report  DATE OF BIRTH: January 01, 1958  PROCEDURE PERFORMED:  Flexible sigmoidoscopy with biopsy.  ENDOSCOPIST:  Anselmo Rod, M.D.  INSTRUMENT USED:  Olympus video colonoscope.  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  Flexible sigmoidoscopy is being done to evaluate extent of disease or recurrence of a problem.  PREPROCEDURE PREPARATION:  Informed consent was procured from the patient. The patient was fasted for eight hours prior to the procedure and prepped with a bottle of Fleets Phospho-Soda the evening prior to the procedure.  PREPROCEDURE PHYSICAL:  VITAL SIGNS:  The patient had stable vital signs.  NECK:  Supple.  LUNGS:  Chest was clear to auscultation.  CARDIAC: S1, S2 is regular.  ABDOMEN:  Abdomen soft with normal abdominal bowel sounds.  DESCRIPTION OF PROCEDURE:  The patient was placed in the left lateral decubitus position and sedated with 50 mg of Demerol and 5 mg of Versed intravenously.  Once the patient was adequately sedated and maintained on low flow oxygen, continuous cardiac monitoring, the Olympus video colonoscope was advanced in the rectum to the mid transverse colon without difficulty.  Except for mild patchy erythema in the distal rectum, no other abnormalities were seen in the entire colonic mucosa up to 90 cm appeared healthy with normal vascular pattern, no erosions, lacerations, masses or polyps were present. There was no evidence of diverticula disease.  A few biopsies were done from the rectum.  The patient tolerated the procedure well without complications.  IMPRESSION:    Essentially healthy appearing colon up to 90 cm with a mild erythema in distal rectum, biopsies for pathology.  RECOMMENDATION: 1. Await pathology results. 2. Outpatient follow up in the next two weeks. DD:  12/08/99 TD:  12/09/99 Job: [id number]

## 2010-09-08 NOTE — Consult Note (Signed)
[name], [name]             ACCOUNT NO.:  [id number]   MEDICAL RECORD NO.:  [id number]          PATIENT TYPE:  INP   LOCATION:  9315                          FACILITY:  [facility]   PHYSICIAN:  Angelia Mould. Derrell Lolling, M.D.DATE OF BIRTH:  04-08-1961   DATE OF CONSULTATION:  DATE OF DISCHARGE:                                 CONSULTATION   CHIEF COMPLAINT:  Abdominal pain.   HISTORY OF PRESENT ILLNESS:  This is a 50 year old white female with a  past history of endometriosis with two surgeries for that.  She was  feeling perfectly well until 8 o'clock p.m. yesterday, April 2 when she  was in her kitchen and developed sudden severe lower abdominal pain  radiating to the lumbar back.  She denies nausea, vomiting or alteration  in her bowel habits.  Her last menstrual period was heavy and ended just  a few days ago.  She denies fever or chills.   She came to the emergency room, was evaluated by one of the nurse  practitioner's and also Dr. Jomarie Longs Sinemet.  They described a pelvic  exam with significant bilateral adnexal tenderness.  The had a CT scan  which shows bilateral adnexal cystic structures with some pelvic  stranding and a small amount of pelvic ascites but also stated that the  appendix was prominent and although not diagnostic, could not  completely rule out appendicitis.  For this reason I was the first  physician called to see the patient.  She has received some analgesics  and feels a little bit better but is still in mild to moderate distress.   PAST MEDICAL HISTORY:  Endometriosis initially diagnosed at laparoscopy  15 years ago.  She required laparotomy for removal of bilateral  endometriomas 13 years ago.  She had a cesarean section in 2004.  She  has only had one pregnancy and one delivery.  She had a laparoscopic  cholecystectomy in 2007.  She had a tonsillectomy as a child.   CURRENT MEDICATIONS:  Lexapro.  Mucinex.  Flonase.   DRUG ALLERGIES:  None known.   FAMILY  HISTORY:  Mother living, has osteoporosis and intermittent atrial  fibrillation.  Father living, question of early dementia, has had skin  cancers.   SOCIAL HISTORY:  The patient is married, has one child.  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  She denies tobacco use.  She drinks alcohol but only occasionally.   REVIEW OF SYSTEMS:  15 system review of systems is performed and is  noncontributory except as described above.   PHYSICAL EXAMINATION:  Pleasant middle-aged woman in mild moderate  distress.  She is a good historian.  Her cousin is with her in the room  throughout the encounter.  Temperature 98.8, pulse 93, respirations 16,  blood pressure 122/79.  Heart rate has come down to 85 after being in  the emergency room for 6 hours.  HEENT:  Eyes:  Sclerae clear.  Extraocular muscles intact.  Ears, mouth and throat, nose, lips, tongue  and oropharynx without gross lesions.  NECK:  Supple, nontender.  No  mass.  No jugular venous distension.  LUNGS:  Clear to auscultation.  No  CVA tenderness.  No chest wall tenderness.  HEART:  Regular rate and  rhythm.  No murmur.  Radial and femoral pulses are palpable.  No  peripheral edema.  BREASTS:  Not examined.  ABDOMEN:  Not distended.  Active bowel sounds.  She is tender with involuntary guarding throughout  the entire lower abdomen.  This is not lateralizing.  There is no hernia  or mass.  There is well-healed laparoscopy scars. PELVIC:  (Performed by  nurse practitioner) Shows bilateral adnexal tenderness.  EXTREMITIES:  She moves all four extremities well without pain or deformity.  NEUROLOGIC:  No gross motor deficits.   DATE OF ADMISSION:  A CT scan as described above.  White blood cell  count 16,200, hemoglobin 11.6.  Complete metabolic panel basically  normal, lipase 32.  Urinalysis clear. Urine pregnancy test negative.   ASSESSMENT:  1. Lower abdominal pain.  The history is extremely atypical for       appendicitis.  The physical exam is nonlocalizing except to the      lower abdomen which is also atypical.  I suspect that endometriosis      or some other gynecologic process is more likely than appendicitis.   PLAN:  1. I have discussed her history and physical findings with Dr. Richardean Chimera.  He has requested that she be transferred to [facility] for his evaluation at this time.  We have agreed to start      her on intravenous antibiotics and so I am going to start her on      some Zosyn.  He will call me if he is uncertain as to the      diagnosis.  If the diagnosis remains in question then a diagnostic      laparoscopy may be required and I will standby for that.  I have      discussed this with the patient and she is in complete agreement.      Angelia Mould. Derrell Lolling, M.D.  Electronically Signed     HMI/MEDQ  D:  07/25/2006  T:  07/25/2006  Job:  [id number]   cc:   Juluis Mire, M.D.  Fax: [phone]   Thora Lance, M.D.  Fax: [phone]

## 2010-10-12 ENCOUNTER — Ambulatory Visit (INDEPENDENT_AMBULATORY_CARE_PROVIDER_SITE_OTHER): Payer: 59 | Admitting: Psychology

## 2010-10-12 DIAGNOSIS — F411 Generalized anxiety disorder: Secondary | ICD-10-CM

## 2011-01-12 LAB — POCT PREGNANCY, URINE

## 2011-01-16 LAB — CBC
Hemoglobin: 12.3
MCHC: 34.6
RBC: 4.03
WBC: 8.3

## 2011-01-16 LAB — HCG, SERUM, QUALITATIVE: Preg, Serum: NEGATIVE

## 2011-01-16 LAB — COMPREHENSIVE METABOLIC PANEL
ALT: 27
Alkaline Phosphatase: 51
BUN: 11
CO2: 32
GFR calc non Af Amer: >60
Glucose, Bld: 102 — ABNORMAL HIGH
Potassium: 3.9
Total Bilirubin: 0.4
Total Protein: 6.9

## 2011-04-09 ENCOUNTER — Ambulatory Visit (INDEPENDENT_AMBULATORY_CARE_PROVIDER_SITE_OTHER): Payer: 59

## 2011-04-09 DIAGNOSIS — R05 Cough: Secondary | ICD-10-CM

## 2011-04-09 DIAGNOSIS — J069 Acute upper respiratory infection, unspecified: Secondary | ICD-10-CM

## 2011-04-29 ENCOUNTER — Ambulatory Visit (INDEPENDENT_AMBULATORY_CARE_PROVIDER_SITE_OTHER): Payer: 59

## 2011-04-29 DIAGNOSIS — Z7251 High risk heterosexual behavior: Secondary | ICD-10-CM

## 2011-04-29 DIAGNOSIS — N390 Urinary tract infection, site not specified: Secondary | ICD-10-CM

## 2011-04-29 DIAGNOSIS — N760 Acute vaginitis: Secondary | ICD-10-CM

## 2011-06-24 ENCOUNTER — Encounter (HOSPITAL_COMMUNITY): Payer: Self-pay

## 2011-06-24 ENCOUNTER — Emergency Department (HOSPITAL_COMMUNITY)
Admission: EM | Admit: 2011-06-24 | Discharge: 2011-06-24 | Disposition: A | Discharge status: Home Health | Payer: 59 | Source: Home / Self Care | Attending: Family Medicine | Admitting: Family Medicine

## 2011-06-24 ENCOUNTER — Emergency Department (INDEPENDENT_AMBULATORY_CARE_PROVIDER_SITE_OTHER): Payer: 59

## 2011-06-24 DIAGNOSIS — R51 Headache: Secondary | ICD-10-CM

## 2011-06-24 MED ORDER — METHYLPREDNISOLONE ACETATE 40 MG/ML IJ SUSP
80.0000 mg | Freq: Once | INTRAMUSCULAR | Status: AC
  Administered 2011-06-24: 80 mg via INTRAMUSCULAR

## 2011-06-24 MED ORDER — FLUTICASONE PROPIONATE 50 MCG/ACT NA SUSP: 2.0000 | Freq: Every day | NASAL | Status: DC

## 2011-06-24 MED ORDER — METHYLPREDNISOLONE ACETATE 80 MG/ML IJ SUSP
INTRAMUSCULAR | Status: AC
  Filled 2011-06-24: qty 1

## 2011-06-24 MED ORDER — AZELASTINE HCL 0.15 % NA SOLN: 2.0000 | Freq: Two times a day (BID) | NASAL | Status: DC

## 2011-06-24 MED ORDER — KETOROLAC TROMETHAMINE 30 MG/ML IJ SOLN
30.0000 mg | Freq: Once | INTRAMUSCULAR | Status: AC
  Administered 2011-06-24: 30 mg via INTRAMUSCULAR

## 2011-06-24 MED ORDER — KETOROLAC TROMETHAMINE 30 MG/ML IJ SOLN
INTRAMUSCULAR | Status: AC
  Filled 2011-06-24: qty 1

## 2011-06-24 NOTE — ED Provider Notes (Signed)
History     CSN: [id number]  Arrival date & time 06/24/11  1619   First MD Initiated Contact with Patient 06/24/11 1621      Chief Complaint  Patient presents with  . Facial Pain    sinus congestion, headache    (Consider location/radiation/quality/duration/timing/severity/associated sxs/prior treatment) Patient is a 51 y.o. female presenting with URI. The history is provided by the patient.  URI The primary symptoms include headaches. Primary symptoms do not include fever, sore throat or cough. The current episode started more than 1 week ago (in [place] last week and had green drainage for 1 day.). This is a new problem. The problem has been gradually worsening (using simply saline, , mucinex-d).  The onset of the illness is associated with recent travel. Symptoms associated with the illness include facial pain, sinus pressure, congestion and rhinorrhea.    History reviewed. No pertinent past medical history.  Past Surgical History  Procedure Date  . Tonsillectomy   . Laproscopic kne   . Laparotomy   . Endometrial ablation     History reviewed. No pertinent family history.  History  Substance Use Topics  . Smoking status: Not on file  . Smokeless tobacco: Not on file  . Alcohol Use:     OB History    Grav Para Term Preterm Abortions TAB SAB Ect Mult Living                  Review of Systems  Constitutional: Negative.  Negative for fever.  HENT: Positive for congestion, rhinorrhea and sinus pressure. Negative for sore throat.   Respiratory: Negative for cough.   Cardiovascular: Negative.   Gastrointestinal: Negative.   Neurological: Positive for headaches.    Allergies  Review of patient's allergies indicates no known allergies.  Home Medications   Current Outpatient Rx  Name Route Sig Dispense Refill  . BUPROPION HCL ER (XL) 300 MG PO TB24 Oral Take 300 mg by mouth daily.    Marland Kitchen CALCIUM & MAGNESIUM CARBONATES 311-232 MG PO TABS Oral Take 1 tablet by mouth  daily.    . OMEGA-3 FATTY ACIDS 1000 MG PO CAPS Oral Take 2 g by mouth daily.    Marland Kitchen ONE-DAILY MULTI VITAMINS PO TABS Oral Take 1 tablet by mouth daily.    Marland Kitchen VITAMIN D (CHOLECALCIFEROL) PO Oral Take by mouth.    . AZELASTINE HCL 0.15 % NA SOLN Nasal Place 2 sprays into the nose 2 (two) times daily. 30 mL 1  . FLUTICASONE PROPIONATE 50 MCG/ACT NA SUSP Nasal Place 2 sprays into the nose daily. 16 g 2    BP 110/75  Pulse 78  Temp(Src) 98.2 F (36.8 C) (Oral)  Resp 16  SpO2 100%  Physical Exam  Nursing note and vitals reviewed. Constitutional: She is oriented to person, place, and time. She appears well-developed and well-nourished. She appears distressed.  HENT:  Head: Normocephalic.  Right Ear: External ear normal.  Left Ear: External ear normal.  Nose: Mucosal edema and rhinorrhea present. Right sinus exhibits maxillary sinus tenderness. Left sinus exhibits maxillary sinus tenderness.  Mouth/Throat: Oropharynx is clear and moist.  Eyes: Conjunctivae are normal. Pupils are equal, round, and reactive to light.  Neck: Normal range of motion. Neck supple.  Cardiovascular: Normal rate, regular rhythm, normal heart sounds and intact distal pulses.   Pulmonary/Chest: Effort normal and breath sounds normal.  Lymphadenopathy:    She has no cervical adenopathy.  Neurological: She is alert and oriented to person, place, and  time.  Skin: Skin is warm and dry.    ED Course  Procedures (including critical care time)  Labs Reviewed - No data to display Dg Sinuses Complete  06/24/2011  *RADIOLOGY REPORT*  Clinical Data: Facial pressure for 2 weeks, worse over the last few days.  PARANASAL SINUSES - COMPLETE 3 + VIEW  Comparison: MRI of the brain 08/10/2008.  Orbital radiographs 05/31/2007.  Findings: The paranasal sinuses appear stable, well aerated without air fluid levels.  No foreign bodies are identified.  IMPRESSION: No evidence of sinusitis.  Original Report Authenticated By: Gerrianne Scale, M.D.     1. Sinus headache       MDM  X-rays reviewed and report per radiologist.         Barkley Bruns, MD 06/24/11 1754

## 2011-06-24 NOTE — ED Notes (Signed)
Sinus congestion started 2 weeks ago,states has sinus pressure/pain which has increased in last two days., c/o nausea, has used ibuprofen and warm showers without relief

## 2011-10-17 DIAGNOSIS — K52832 Lymphocytic colitis: Secondary | ICD-10-CM | POA: Insufficient documentation

## 2011-10-17 DIAGNOSIS — F53 Postpartum depression: Secondary | ICD-10-CM | POA: Insufficient documentation

## 2012-08-15 ENCOUNTER — Encounter (HOSPITAL_COMMUNITY): Payer: Self-pay

## 2012-08-15 ENCOUNTER — Other Ambulatory Visit: Payer: Self-pay | Admitting: Gastroenterology

## 2012-08-15 ENCOUNTER — Ambulatory Visit (HOSPITAL_COMMUNITY)
Admission: RE | Admit: 2012-08-15 | Discharge: 2012-08-15 | Disposition: A | Discharge status: Home / Self Care | Payer: 59 | Source: Ambulatory Visit | Attending: Gastroenterology | Admitting: Gastroenterology

## 2012-08-15 DIAGNOSIS — I862 Pelvic varices: Secondary | ICD-10-CM | POA: Insufficient documentation

## 2012-08-15 DIAGNOSIS — N809 Endometriosis, unspecified: Secondary | ICD-10-CM | POA: Insufficient documentation

## 2012-08-15 DIAGNOSIS — R109 Unspecified abdominal pain: Secondary | ICD-10-CM | POA: Insufficient documentation

## 2012-08-15 DIAGNOSIS — R1032 Left lower quadrant pain: Secondary | ICD-10-CM

## 2012-08-15 DIAGNOSIS — M25552 Pain in left hip: Secondary | ICD-10-CM

## 2012-08-15 DIAGNOSIS — K5190 Ulcerative colitis, unspecified, without complications: Secondary | ICD-10-CM | POA: Insufficient documentation

## 2012-08-15 DIAGNOSIS — N949 Unspecified condition associated with female genital organs and menstrual cycle: Secondary | ICD-10-CM | POA: Insufficient documentation

## 2012-08-15 MED ORDER — IOHEXOL 300 MG/ML  SOLN
100.0000 mL | Freq: Once | INTRAMUSCULAR | Status: AC | PRN
  Administered 2012-08-15: 100 mL via INTRAVENOUS

## 2012-09-24 ENCOUNTER — Emergency Department (HOSPITAL_COMMUNITY)
Admission: EM | Admit: 2012-09-24 | Discharge: 2012-09-24 | Disposition: A | Discharge status: Home / Self Care | Payer: PRIVATE HEALTH INSURANCE | Attending: Emergency Medicine | Admitting: Emergency Medicine

## 2012-09-24 ENCOUNTER — Encounter (HOSPITAL_COMMUNITY): Payer: Self-pay | Admitting: Emergency Medicine

## 2012-09-24 ENCOUNTER — Emergency Department (HOSPITAL_COMMUNITY): Payer: PRIVATE HEALTH INSURANCE

## 2012-09-24 DIAGNOSIS — Z79899 Other long term (current) drug therapy: Secondary | ICD-10-CM | POA: Insufficient documentation

## 2012-09-24 DIAGNOSIS — Y9289 Other specified places as the place of occurrence of the external cause: Secondary | ICD-10-CM | POA: Insufficient documentation

## 2012-09-24 DIAGNOSIS — IMO0002 Reserved for concepts with insufficient information to code with codable children: Secondary | ICD-10-CM | POA: Insufficient documentation

## 2012-09-24 DIAGNOSIS — Y9389 Activity, other specified: Secondary | ICD-10-CM | POA: Insufficient documentation

## 2012-09-24 DIAGNOSIS — W010XXA Fall on same level from slipping, tripping and stumbling without subsequent striking against object, initial encounter: Secondary | ICD-10-CM | POA: Insufficient documentation

## 2012-09-24 DIAGNOSIS — Z8719 Personal history of other diseases of the digestive system: Secondary | ICD-10-CM | POA: Insufficient documentation

## 2012-09-24 MED ORDER — HYDROCODONE-ACETAMINOPHEN 5-325 MG PO TABS
2.0000 | ORAL_TABLET | Freq: Once | ORAL | Status: AC
  Administered 2012-09-24: 2 via ORAL
  Filled 2012-09-24: qty 2

## 2012-09-24 MED ORDER — HYDROCODONE-ACETAMINOPHEN 5-325 MG PO TABS: 2.0000 | ORAL_TABLET | ORAL | Status: DC | PRN

## 2012-09-24 NOTE — ED Notes (Signed)
Patient educated not to drive, operate heavy machinery, or drink alcohol while taking narcotics.

## 2012-09-24 NOTE — ED Provider Notes (Signed)
History     CSN: [id number]  Arrival date & time 09/24/12  4098   First MD Initiated Contact with Patient 09/24/12 [phone]      Chief Complaint  Patient presents with  . Fall  . Tailbone Pain  . Back Pain    (Consider location/radiation/quality/duration/timing/severity/associated sxs/prior treatment) HPI Complains of low back pain and pain in pelvis after she slipped and landed on her proximal and nearly prior to coming here. Pain is worse with movement improved with remaining still. Nonradiating. She was treated by EMS with long board hard collar and CID. No other complaint  History reviewed. No pertinent past medical history. Ulcerative colitis Past Surgical History  Procedure Laterality Date  . Tonsillectomy    . Laproscopic kne    . Laparotomy    . Endometrial ablation      No family history on file.  History  Substance Use Topics  . Smoking status: Not on file  . Smokeless tobacco: Not on file  . Alcohol Use:     OB History   Grav Para Term Preterm Abortions TAB SAB Ect Mult Living                  Review of Systems  Constitutional: Negative.   HENT: Negative.   Gastrointestinal: Negative.   Genitourinary: Negative.   Musculoskeletal: Positive for back pain.  Allergic/Immunologic: Negative.   Neurological: Negative.   Psychiatric/Behavioral: Negative.     Allergies  Review of patient's allergies indicates no known allergies.  Home Medications   Current Outpatient Rx  Name  Route  Sig  Dispense  Refill  . Ascorbic Acid (VITAMIN C PO)   Oral   Take 1 tablet by mouth daily.         . fish oil-omega-3 fatty acids 1000 MG capsule   Oral   Take 2 g by mouth daily.         . Multiple Vitamin (MULTIVITAMIN) tablet   Oral   Take 1 tablet by mouth daily.         . Pantoprazole Sodium (PROTONIX PO)   Oral   Take 1 tablet by mouth daily. [place]         . Probiotic Product (PROBIOTIC DAILY PO)   Oral   Take 1  tablet by mouth daily.         . traZODone (DESYREL) 50 MG tablet   Oral   Take 50 mg by mouth at bedtime as needed for sleep.         Marland Kitchen VITAMIN D, CHOLECALCIFEROL, PO   Oral   Take 1 tablet by mouth daily.            BP 103/67  Pulse 78  Temp(Src) 98.2 F (36.8 C) (Oral)  Resp 18  SpO2 100%  Physical Exam  Nursing note and vitals reviewed. Constitutional: She is oriented to person, place, and time. She appears well-developed and well-nourished. She appears distressed.  Appears uncomfortable  HENT:  Head: Normocephalic and atraumatic.  Eyes: Conjunctivae are normal. Pupils are equal, round, and reactive to light.  Neck: Neck supple. No tracheal deviation present. No thyromegaly present.  Cardiovascular: Normal rate and regular rhythm.   No murmur heard. Pulmonary/Chest: Effort normal and breath sounds normal.  Abdominal: Soft. Bowel sounds are normal. She exhibits no distension. There is no tenderness.  Musculoskeletal: Normal range of motion. She exhibits no edema and no tenderness.   cervical spine and thoracic spine nontender mildly  tender over lumbar spine and pelvis on compression. No deformity all 4 extremities without contusion abrasion or tenderness neurovascularly intact   Neurological: She is alert and oriented to person, place, and time. She has normal reflexes. No cranial nerve deficit. Coordination normal.  Skin: Skin is warm and dry. No rash noted.  Psychiatric: She has a normal mood and affect.    ED Course  Procedures (including critical care time)  Labs Reviewed - No data to display No results found.   No diagnosis found. Results for orders placed during the hospital encounter of 11/30/07  POCT RAPID STREP A (DEVICE)      Result Value Range   Streptococcus, Group A Screen (Direct) NEGATIVE     Dg Lumbar Spine Complete  09/24/2012   *RADIOLOGY REPORT*  Clinical Data: Fall, back pain  LUMBAR SPINE - COMPLETE 4+ VIEW  Comparison: None.  Findings:  Five views of the lumbar spine submitted.  No acute fracture or subluxation.  Alignment and vertebral height are preserved.  Mild disc space flattening at L5 S1 level.  Post tubal ligation surgical clips are noted.  IMPRESSION: No acute fracture or subluxation.  Mild disc space flattening at L5- S1 level.   Original Report Authenticated By: Natasha Mead, M.D.   Dg Pelvis 1-2 Views  09/24/2012   *RADIOLOGY REPORT*  Clinical Data: Fall, right hip pain  PELVIS - 1-2 VIEW  Comparison: 08/15/2012  Findings: Single frontal view of the pelvis submitted.  No displaced fracture or subluxation.  Post tubal ligation surgical clips are noted.  There is spurring and chronic fragmentation of the right lesser femoral trochanter.  IMPRESSION: No acute fracture or subluxation.   Original Report Authenticated By: Natasha Mead, M.D.    11:30 AM pain improved after treatment with Vicodin. Patient is alert and ambulatory xrays viewed by me   MDM  Plan prescription Vicodin Followup PMD as needed if continued pain next week. Diagnosis #1 fall #2 back pain        Doug Sou, MD 09/24/12 1041

## 2012-09-24 NOTE — ED Notes (Signed)
Pt was at work slipped on floor when going through a door. Pt c/o tailbone pain and lumbar back pain that radiates to her left hip area.  Pt immobilized on LSB.

## 2012-09-24 NOTE — ED Notes (Addendum)
Patient reports that she has had a Lialda and a Welbutrin in the past 30 days and that she wants to make this known.

## 2012-10-20 ENCOUNTER — Telehealth: Payer: Self-pay | Admitting: Internal Medicine

## 2012-10-20 NOTE — Telephone Encounter (Signed)
ERROR

## 2013-04-10 ENCOUNTER — Ambulatory Visit: Payer: 59 | Attending: Orthopaedic Surgery | Admitting: Physical Therapy

## 2013-04-10 DIAGNOSIS — M545 Low back pain, unspecified: Secondary | ICD-10-CM | POA: Insufficient documentation

## 2013-04-10 DIAGNOSIS — IMO0001 Reserved for inherently not codable concepts without codable children: Secondary | ICD-10-CM | POA: Insufficient documentation

## 2013-04-14 ENCOUNTER — Ambulatory Visit: Payer: 59

## 2013-04-21 ENCOUNTER — Ambulatory Visit: Payer: 59

## 2013-04-22 ENCOUNTER — Ambulatory Visit: Payer: 59 | Admitting: Physical Therapy

## 2013-04-24 ENCOUNTER — Ambulatory Visit: Payer: 59 | Attending: Orthopaedic Surgery | Admitting: Physical Therapy

## 2013-04-24 DIAGNOSIS — M545 Low back pain, unspecified: Secondary | ICD-10-CM | POA: Insufficient documentation

## 2013-04-24 DIAGNOSIS — IMO0001 Reserved for inherently not codable concepts without codable children: Secondary | ICD-10-CM | POA: Insufficient documentation

## 2013-04-28 ENCOUNTER — Ambulatory Visit: Payer: 59 | Admitting: Physical Therapy

## 2013-04-30 ENCOUNTER — Ambulatory Visit: Payer: 59 | Admitting: Physical Therapy

## 2013-05-05 ENCOUNTER — Encounter: Payer: Self-pay | Admitting: Physical Therapy

## 2013-05-07 ENCOUNTER — Ambulatory Visit: Payer: 59 | Admitting: Physical Therapy

## 2013-05-25 ENCOUNTER — Ambulatory Visit: Payer: 59 | Attending: Orthopaedic Surgery | Admitting: Physical Therapy

## 2013-05-25 DIAGNOSIS — M545 Low back pain, unspecified: Secondary | ICD-10-CM | POA: Insufficient documentation

## 2013-05-25 DIAGNOSIS — IMO0001 Reserved for inherently not codable concepts without codable children: Secondary | ICD-10-CM | POA: Insufficient documentation

## 2013-05-29 ENCOUNTER — Ambulatory Visit: Payer: 59 | Admitting: Physical Therapy

## 2013-06-01 ENCOUNTER — Ambulatory Visit: Payer: 59 | Admitting: Physical Therapy

## 2013-06-04 ENCOUNTER — Ambulatory Visit: Payer: 59 | Admitting: Physical Therapy

## 2014-02-04 ENCOUNTER — Other Ambulatory Visit: Payer: Self-pay | Admitting: Obstetrics and Gynecology

## 2014-02-05 LAB — CYTOLOGY - PAP

## 2014-02-17 ENCOUNTER — Other Ambulatory Visit: Payer: Self-pay | Admitting: Obstetrics and Gynecology

## 2014-02-17 DIAGNOSIS — N63 Unspecified lump in unspecified breast: Secondary | ICD-10-CM

## 2014-03-02 ENCOUNTER — Ambulatory Visit
Admission: RE | Admit: 2014-03-02 | Discharge: 2014-03-02 | Disposition: A | Discharge status: Home / Self Care | Payer: 59 | Source: Ambulatory Visit | Attending: Obstetrics and Gynecology | Admitting: Obstetrics and Gynecology

## 2014-03-02 DIAGNOSIS — N63 Unspecified lump in unspecified breast: Secondary | ICD-10-CM

## 2014-12-14 ENCOUNTER — Emergency Department
Admission: EM | Admit: 2014-12-14 | Discharge: 2014-12-14 | Disposition: A | Discharge status: Home / Self Care | Payer: 59 | Source: Home / Self Care | Attending: Family Medicine | Admitting: Family Medicine

## 2014-12-14 ENCOUNTER — Encounter: Payer: Self-pay | Admitting: *Deleted

## 2014-12-14 DIAGNOSIS — S81812A Laceration without foreign body, left lower leg, initial encounter: Secondary | ICD-10-CM

## 2014-12-14 NOTE — ED Notes (Signed)
Pt c/o LT lower leg extremity laceration x 12 N today. She reports cutting on a flower pot at home. She is unsure of her last Tdap, she will check with her PCP tomorrow.

## 2014-12-14 NOTE — ED Provider Notes (Signed)
CSN: [id number]     Arrival date & time 12/14/14  1821 History   First MD Initiated Contact with Patient 12/14/14 1830     Chief Complaint  Patient presents with  . Extremity Laceration   (Consider location/radiation/quality/duration/timing/severity/associated sxs/prior Treatment) HPI The patient is a 54 year old female presenting to urgent care with complaints of laceration to her left lower leg that occurred around 12 PM today.  Patient states a flowerpot fell onto her leg while she was at home.  Denies initial pain and states she first noticed blood dripping down her leg.  Bleeding was controlled with pressure.  Patient states she was unsure if she needed to come in, however, due to continued bleeding decided she needed to be evaluated further.  Pain is aching sore and throbbing 6/10 at worst.  She has not taken any pain medication today.  Patient is unsure of her T status and she will check tomorrow with her PCP.  Denies any other injury.  Patient is not on blood thinners.  History reviewed. No pertinent past medical history. Past Surgical History  Procedure Laterality Date  . Tonsillectomy    . Laproscopic kne    . Laparotomy    . Endometrial ablation     Family History  Problem Relation Age of Onset  . Goiter Mother   . Irregular heart beat Mother     Afibrillation  . Heart attack Mother   . Heart failure Father   . Thyroid disease Father   . Irregular heart beat Father     Afibrillation  . Heart disease Father   . Diabetes Brother    Social History  Substance Use Topics  . Smoking status: Never Smoker   . Smokeless tobacco: None  . Alcohol Use: Yes   OB History    No data available     Review of Systems  Musculoskeletal: Positive for myalgias. Negative for arthralgias.       Left lower leg  Skin: Positive for wound. Negative for color change.  Neurological: Negative for weakness and numbness.    Allergies  Review of patient's allergies indicates no known  allergies.  Home Medications   Prior to Admission medications   Medication Sig Start Date End Date Taking? Authorizing Provider  buPROPion (WELLBUTRIN XL) 300 MG 24 hr tablet Take 300 mg by mouth daily.   Yes Historical Provider, MD  Ascorbic Acid (VITAMIN C PO) Take 1 tablet by mouth daily.    Historical Provider, MD  fish oil-omega-3 fatty acids 1000 MG capsule Take 2 g by mouth daily.    Historical Provider, MD  Multiple Vitamin (MULTIVITAMIN) tablet Take 1 tablet by mouth daily.    Historical Provider, MD  Pantoprazole Sodium (PROTONIX PO) Take 1 tablet by mouth daily. [place] [facility]    Historical Provider, MD  Probiotic Product (PROBIOTIC DAILY PO) Take 1 tablet by mouth daily.    Historical Provider, MD  traZODone (DESYREL) 50 MG tablet Take 50 mg by mouth at bedtime as needed for sleep.    Historical Provider, MD  VITAMIN D, CHOLECALCIFEROL, PO Take 1 tablet by mouth daily.     Historical Provider, MD   BP 104/72 mmHg  Pulse 80  Temp(Src) 98.4 F (36.9 C) (Oral)  Resp 18  SpO2 98% Physical Exam  Constitutional: She is oriented to person, place, and time. She appears well-developed and well-nourished.  HENT:  Head: Normocephalic and atraumatic.  Eyes: EOM are normal.  Neck: Normal range of motion.  Cardiovascular: Normal rate.   Pulses:      Dorsalis pedis pulses are 2+ on the left side.  Pulmonary/Chest: Effort normal.  Musculoskeletal: Normal range of motion. She exhibits tenderness.  Left lower leg, anterolateral aspect: mild tenderness around 5cm laceration (see skin exam). Calf is soft, non-tender. FROM Left knee.  Neurological: She is alert and oriented to person, place, and time.  Left lower leg: sensation normal.  Skin: Skin is warm and dry. Laceration noted.     Left lower leg, anterolateral aspect: 5cm laceration elipital shaped through dermis, minimal red blood. No foreign bodies seen or palpated.  Psychiatric: She has a normal mood and  affect. Her behavior is normal.  Nursing note and vitals reviewed.   ED Course  LACERATION REPAIR Date/Time: 12/14/2014 7:18 PM Performed by: Noland Fordyce Authorized by: Theone Murdoch A Consent: Verbal consent obtained. Risks and benefits: risks, benefits and alternatives were discussed Consent given by: patient Patient understanding: patient states understanding of the procedure being performed Patient consent: the patient's understanding of the procedure matches consent given Procedure consent: procedure consent matches procedure scheduled Relevant documents: relevant documents present and verified Site marked: the operative site was marked Required items: required blood products, implants, devices, and special equipment available Patient identity confirmed: verbally with patient Time out: Immediately prior to procedure a "time out" was called to verify the correct patient, procedure, equipment, support staff and site/side marked as required. Body area: lower extremity Location details: left lower leg Laceration length: 5 cm Foreign bodies: no foreign bodies Tendon involvement: none Nerve involvement: none Vascular damage: no Anesthesia: local infiltration Local anesthetic: topical anesthetic and lidocaine 2% with epinephrine Anesthetic total: 1 ml Patient sedated: no Preparation: Patient was prepped and draped in the usual sterile fashion. Irrigation solution: saline Irrigation method: syringe Amount of cleaning: standard Debridement: none Degree of undermining: none Skin closure: 4-0 Prolene Number of sutures: 6 Technique: simple Approximation: close Approximation difficulty: simple Dressing: 4x4 sterile gauze and antibiotic ointment Patient tolerance: Patient tolerated the procedure well with no immediate complications   (including critical care time) Labs Review Labs Reviewed - No data to display  Imaging Review No results found.   MDM   1. Leg  laceration, left, initial encounter     Patient presenting to urgent care with laceration to her left lower leg.  Left leg is neurovascularly intact.  No foreign body seen or palpated on exam.  Laceration was closed with 6 interrupted sutures.  No immediate complication.  Home care instruction packet for sutured wound provided.  Patient advised to return in 10 days for suture removal.  Discussed symptoms that warrant sooner follow-up, including signs and symptoms of infection with increased pain, redness, swelling or pus.  Advised she may take 600-800 mg ibuprofen every 6-8 hours as needed for pain and swelling.  Elevate leg to help with pain and swelling. Patient verbalized understanding and agreement with treatment plan.     Noland Fordyce, PA-C 12/14/14 1931

## 2014-12-14 NOTE — Discharge Instructions (Signed)
You may take 600-800mg  ibuprofen every 6-8 hours as needed for pain.  Keep your leg elevated for first 2-3 days to help with swelling and pain.  Follow up in 10 days for suture removal.

## 2014-12-18 ENCOUNTER — Telehealth: Payer: Self-pay | Admitting: Emergency Medicine

## 2014-12-24 ENCOUNTER — Encounter: Payer: Self-pay | Admitting: Emergency Medicine

## 2014-12-24 ENCOUNTER — Emergency Department
Admission: EM | Admit: 2014-12-24 | Discharge: 2014-12-24 | Disposition: A | Discharge status: Home / Self Care | Payer: 59 | Source: Home / Self Care

## 2014-12-24 NOTE — ED Notes (Signed)
Sutures removed, no redness, no signs of infection

## 2015-02-11 ENCOUNTER — Other Ambulatory Visit: Payer: Self-pay | Admitting: Nurse Practitioner

## 2015-02-11 ENCOUNTER — Ambulatory Visit
Admission: RE | Admit: 2015-02-11 | Discharge: 2015-02-11 | Disposition: A | Discharge status: Home / Self Care | Payer: 59 | Source: Ambulatory Visit | Attending: Nurse Practitioner | Admitting: Nurse Practitioner

## 2015-02-11 DIAGNOSIS — R05 Cough: Secondary | ICD-10-CM

## 2015-02-11 DIAGNOSIS — R059 Cough, unspecified: Secondary | ICD-10-CM

## 2015-02-21 ENCOUNTER — Other Ambulatory Visit: Payer: Self-pay | Admitting: Obstetrics and Gynecology

## 2015-02-21 DIAGNOSIS — R928 Other abnormal and inconclusive findings on diagnostic imaging of breast: Secondary | ICD-10-CM

## 2015-02-25 ENCOUNTER — Ambulatory Visit
Admission: RE | Admit: 2015-02-25 | Discharge: 2015-02-25 | Disposition: A | Discharge status: Home / Self Care | Payer: 59 | Source: Ambulatory Visit | Attending: Obstetrics and Gynecology | Admitting: Obstetrics and Gynecology

## 2015-02-25 DIAGNOSIS — R928 Other abnormal and inconclusive findings on diagnostic imaging of breast: Secondary | ICD-10-CM

## 2015-04-28 MED FILL — LIALDA 1.2 GM TABLET SA: 1.2 | 30 days supply | Qty: 120 | Fill #0

## 2015-07-21 MED FILL — traZODone HCL 50 MG TABS: 50 | 90 days supply | Qty: 90 | Fill #0

## 2015-07-25 DIAGNOSIS — J4521 Mild intermittent asthma with (acute) exacerbation: Secondary | ICD-10-CM | POA: Diagnosis not present

## 2015-07-25 DIAGNOSIS — R05 Cough: Secondary | ICD-10-CM | POA: Diagnosis not present

## 2015-07-25 MED FILL — HYDROCODONE-CHLORPHENIRAM S: 10-8 | 10 days supply | Qty: 100 | Fill #0

## 2015-07-25 MED FILL — predniSONE 20 MG TABS: 20 | 4 days supply | Qty: 4 | Fill #0

## 2015-07-25 MED FILL — LIALDA 1.2 GM TABLET SA: 1.2 | 30 days supply | Qty: 120 | Fill #1

## 2015-08-29 DIAGNOSIS — Z113 Encounter for screening for infections with a predominantly sexual mode of transmission: Secondary | ICD-10-CM | POA: Diagnosis not present

## 2015-08-29 DIAGNOSIS — Z1159 Encounter for screening for other viral diseases: Secondary | ICD-10-CM | POA: Diagnosis not present

## 2015-08-29 DIAGNOSIS — N760 Acute vaginitis: Secondary | ICD-10-CM | POA: Diagnosis not present

## 2015-08-29 DIAGNOSIS — Z114 Encounter for screening for human immunodeficiency virus [HIV]: Secondary | ICD-10-CM | POA: Diagnosis not present

## 2015-08-29 MED FILL — metroNIDAZOLE 0.75 % GEL: 0.75 | 5 days supply | Qty: 70 | Fill #0

## 2015-09-01 MED FILL — BUPROPION HCL XL 300 MG TAB: 300 | 90 days supply | Qty: 90 | Fill #1

## 2015-09-13 DIAGNOSIS — D239 Other benign neoplasm of skin, unspecified: Secondary | ICD-10-CM | POA: Diagnosis not present

## 2015-09-13 DIAGNOSIS — L0390 Cellulitis, unspecified: Secondary | ICD-10-CM | POA: Diagnosis not present

## 2015-09-13 DIAGNOSIS — L821 Other seborrheic keratosis: Secondary | ICD-10-CM | POA: Diagnosis not present

## 2015-09-14 MED FILL — SULFAMETHOXAZOLE-TMP DS TAB: 800-160 | 14 days supply | Qty: 28 | Fill #0

## 2015-09-21 ENCOUNTER — Encounter: Payer: Self-pay | Admitting: Family Medicine

## 2015-09-21 ENCOUNTER — Ambulatory Visit (INDEPENDENT_AMBULATORY_CARE_PROVIDER_SITE_OTHER): Payer: 59 | Admitting: Family Medicine

## 2015-09-21 VITALS — BP 98/65 | HR 84 | Ht 66.5 in | Wt 174.6 lb

## 2015-09-21 DIAGNOSIS — E663 Overweight: Secondary | ICD-10-CM | POA: Diagnosis not present

## 2015-09-21 DIAGNOSIS — O99345 Other mental disorders complicating the puerperium: Secondary | ICD-10-CM

## 2015-09-21 DIAGNOSIS — F53 Postpartum depression: Secondary | ICD-10-CM

## 2015-09-21 DIAGNOSIS — L0390 Cellulitis, unspecified: Secondary | ICD-10-CM

## 2015-09-21 DIAGNOSIS — L0291 Cutaneous abscess, unspecified: Secondary | ICD-10-CM | POA: Diagnosis not present

## 2015-09-21 DIAGNOSIS — K5190 Ulcerative colitis, unspecified, without complications: Secondary | ICD-10-CM | POA: Insufficient documentation

## 2015-09-21 NOTE — Patient Instructions (Signed)
Advised to start wt lifting along with HIIT routines to aid in boosting metabolism. Stop ABX- wound care as discussed F/up for routine care/ labs etc in near future when due for yrly  Exercising to Lose Weight Exercising can help you to lose weight. In order to lose weight through exercise, you need to do vigorous-intensity exercise. You can tell that you are exercising with vigorous intensity if you are breathing very hard and fast and cannot hold a conversation while exercising. Moderate-intensity exercise helps to maintain your current weight. You can tell that you are exercising at a moderate level if you have a higher heart rate and faster breathing, but you are still able to hold a conversation. HOW OFTEN SHOULD I EXERCISE? Choose an activity that you enjoy and set realistic goals. Your health care provider can help you to make an activity plan that works for you. Exercise regularly as directed by your health care provider. This may include:  Doing resistance training twice each week, such as:  Push-ups.  Sit-ups.  Lifting weights.  Using resistance bands.  Doing a given intensity of exercise for a given amount of time. Choose from these options:  150 minutes of moderate-intensity exercise every week.  75 minutes of vigorous-intensity exercise every week.  A mix of moderate-intensity and vigorous-intensity exercise every week. Children, pregnant women, people who are out of shape, people who are overweight, and older adults may need to consult a health care provider for individual recommendations. If you have any sort of medical condition, be sure to consult your health care provider before starting a new exercise program. WHAT ARE SOME ACTIVITIES THAT CAN HELP ME TO LOSE WEIGHT?   Walking at a rate of at least 4.5 miles an hour.  Jogging or running at a rate of 5 miles per hour.  Biking at a rate of at least 10 miles per hour.  Lap swimming.  Roller-skating or in-line  skating.  Cross-country skiing.  Vigorous competitive sports, such as football, basketball, and soccer.  Jumping rope.  Aerobic dancing. HOW CAN I BE MORE ACTIVE IN MY DAY-TO-DAY ACTIVITIES?  Use the stairs instead of the elevator.  Take a walk during your lunch break.  If you drive, park your car farther away from work or school.  If you take public transportation, get off one stop early and walk the rest of the way.  Make all of your phone calls while standing up and walking around.  Get up, stretch, and walk around every 30 minutes throughout the day. WHAT GUIDELINES SHOULD I FOLLOW WHILE EXERCISING?  Do not exercise so much that you hurt yourself, feel dizzy, or get very short of breath.  Consult your health care provider prior to starting a new exercise program.  Wear comfortable clothes and shoes with good support.  Drink plenty of water while you exercise to prevent dehydration or heat stroke. Body water is lost during exercise and must be replaced.  Work out until you breathe faster and your heart beats faster.   This information is not intended to replace advice given to you by your health care provider. Make sure you discuss any questions you have with your health care provider.   Document Released: 05/12/2010 Document Revised: 04/30/2014 Document Reviewed: 09/10/2013 Elsevier Interactive Patient Education 2016 [place] [employer] for Health  1. Water Drink at least 8 to 12 cups of water daily. Consume half of your body weight in pounds, is the amount of  water in ounces to drink daily.  Ie: a 200lb person = 100 oz water daily  2. Dark Green Vegetables Eat dark green vegetables at least three to four times a week. Good options include broccoli, peppers, brussel sprouts and leafy greens like kale and spinach.  3. Whole Grains Whole grains should be included in your diet at least two to three times daily. Look for whole wheat flour, rye, oatmeal,  barley, amaranth, quinoa or a multigrain. A good source of fiber includes 3 to 4 grams of fiber per serving. A great source has 5 or more grams of fiber per serving.  4. Beans and Lentils Try to eat a bean-based meal at least once a week. Try to add legumes, including beans and lentils, to soups, stews, casseroles, salads and dips or eat them plain.  5. Fish Try to eat two to three serving of fish a week. A serving consists of 3 to 4 ounces of cooked fish. Good choices are salmon, trout, herring, bluefish, sardines and tuna.  6. Berries Include two to four servings of fruit in your diet each day. Try to eat berries such as raspberries, blueberries, blackberries and strawberries.  7. Winter Squash Eat butternut and acorn squash as well as other richly pigmented dark orange and green colored vegetables like sweet potato, cantaloupe and mango.  8. Soy 25 grams of soy protein a day is recommended as part of a low-fat diet to help lower cholesterol levels. Try tofu, soymilk, edamame soybeans, tempeh and texturized vegetable protein (TVP).  9. Flaxseed, Nuts and Seeds Add 1 to 2 tablespoons of ground flaxseed or other seeds to food each day or include a moderate amount of nuts - 1/4 cup - in your daily diet.  10. Organic Yogurt Men and women between 24 and 46 years of age need 1000 milligrams of calcium a day and 1200 milligrams if 65 or older. Eat calcium-rich foods such as nonfat or low-fat dairy products three to four times a day. Include organic choices.

## 2015-09-21 NOTE — Assessment & Plan Note (Signed)
Continue medications.  Stress management techniques discussed with patient.  importance of exercise and healthy lifestyle

## 2015-09-21 NOTE — Assessment & Plan Note (Signed)
Extensive counseling regarding Mediterranean diet- healthy and unhealthy fats, increasing fruits and veggies etc. Patient's diet is very healthy. Extensive counseling about exercise and HIIT routines

## 2015-09-21 NOTE — Progress Notes (Deleted)
   Subjective:    Patient ID: [name], female    DOB: 26-Feb-1961, 55 y.o.   MRN: [id number]  HPI    Review of Systems     Objective:   Physical Exam Body mass index is 27.76 kg/(m^2).        Assessment & Plan:

## 2015-09-21 NOTE — Assessment & Plan Note (Addendum)
Since patient has had 8 days of antibiotic use and lesion appears to be healing well, discontinue antibiotics now since so poorly tolerated. Wound care discussed with patient. If worsens, return to clinic

## 2015-09-21 NOTE — Progress Notes (Signed)
Marjory Sneddon, D.O. Family Medicine Physician [facility] Location: Primary Care at [facility]     Subjective:    CC: New pt, here to establish care.   HPI: [name] is a pleasant 55 y.o. female who presents to [place] at [facility] today To discuss her weight and health goals, and to discuss a cellulitic papule to her right cheek.  1) approximately 10 days ago she noticed a red pimple-appearing lesion on her right cheek.  She scratched it and tried to pop it which resulted in it becoming infected.  She had an follow-up with the dermatologist shortly thereafter who put her on Bactrim DS.   She's been on the medicine for 8 days now and presents because she is feeling very nauseous, ill and just not like herself. Here for further evaluation and recommendation  2) patient has been walking every day for 40-45 minutes since January and has been eating a prudent diet. She is frustrated with her lack of substantial weight loss and wonders today what else she can do    History reviewed. No pertinent past medical history.  Past Surgical History  Procedure Laterality Date  . Tonsillectomy    . Laproscopic kne    . Laparotomy    . Endometrial ablation    . Cholecystectomy    . Appendectomy      Family History  Problem Relation Age of Onset  . Goiter Mother   . Irregular heart beat Mother     Afibrillation  . Heart attack Mother   . Heart failure Father   . Thyroid disease Father   . Irregular heart beat Father     Afibrillation  . Heart disease Father   . Diabetes Brother     History  Drug Use No  ,  History  Alcohol Use  . Yes  ,  History  Smoking status  . Never Smoker   Smokeless tobacco  . Never Used  ,  History  Sexual Activity  . Sexual Activity: Yes  . Birth Control/ Protection: Other-see comments    Comment: endometrial ablation    Patient's Medications  New Prescriptions   No medications on file   Previous Medications   BUPROPION (WELLBUTRIN XL) 300 MG 24 HR TABLET    Take 300 mg by mouth daily.   MESALAMINE (LIALDA) 1.2 G EC TABLET    Take 2 tablets by mouth daily with breakfast.   MULTIPLE VITAMIN (MULTIVITAMIN) TABLET    Take 1 tablet by mouth daily.   PROBIOTIC PRODUCT (PROBIOTIC DAILY PO)    Take 1 tablet by mouth daily.   SULFAMETHOXAZOLE-TRIMETHOPRIM (BACTRIM DS,SEPTRA DS) 800-160 MG TABLET    Take 1 tablet by mouth 2 (two) times daily.   TERCONAZOLE (TERAZOL 3) 0.8 % VAGINAL CREAM    Place 1 applicator vaginally as needed.   TRAZODONE (DESYREL) 50 MG TABLET    Take 50 mg by mouth at bedtime as needed for sleep.   VITAMIN D, CHOLECALCIFEROL, PO    Take 1 tablet by mouth daily.   Modified Medications   No medications on file  Discontinued Medications   ASCORBIC ACID (VITAMIN C PO)    Take 1 tablet by mouth daily.   FISH OIL-OMEGA-3 FATTY ACIDS 1000 MG CAPSULE    Take 2 g by mouth daily.   PANTOPRAZOLE SODIUM (PROTONIX PO)    Take 1 tablet by mouth daily. [place] by friendly    ALLERGIES:  Review of patient's allergies indicates no known allergies.   Review of Systems: Full 14 point ROS performed via "adult medical history form".  Negative except for noted above    Objective:   Blood pressure 98/65, pulse 84, height 5' 6.5" (1.689 m), weight 174 lb 9.6 oz (79.198 kg). Body mass index is 27.76 kg/(m^2).  General: Well Developed, well nourished, and in no acute distress.  Neuro: Alert and oriented x3, extra-ocular muscles intact, sensation grossly intact.  HEENT: Normocephalic, atraumatic, pupils equal round reactive to light, neck supple Skin: no gross suspicious lesions or rashes  Cardiac: Regular rate and rhythm, no murmurs rubs or gallops.  Respiratory: Essentially clear to auscultation bilaterally. Not using accessory muscles, speaking in full sentences.  Abdominal: Soft, not grossly distended Musculoskeletal: Ambulates w/o diff, FROM * 4 ext.  Vasc:  less 2 sec cap RF, warm and pink  Psych:  No HI/SI, judgement and insight good.    Impression and Recommendations:    The patient was counselled, risk factors were discussed, anticipatory guidance given.  Depression, postpartum Continue medications.  Stress management techniques discussed with patient.  importance of exercise and healthy lifestyle  Overweight (BMI 25.0-29.9) Extensive counseling regarding Mediterranean diet- healthy and unhealthy fats, increasing fruits and veggies etc. Patient's diet is very healthy. Extensive counseling about exercise and HIIT routines  Cellulitis and abscess Since patient has had 8 days of antibiotic use and lesion appears to be healing well, discontinue antibiotics now since so poorly tolerated. Wound care discussed with patient. If worsens, return to clinic   Note: This document was prepared using Dragon voice recognition software and may include unintentional dictation errors.     __________________________________________________________________________________

## 2015-09-22 DIAGNOSIS — Z86010 Personal history of colonic polyps: Secondary | ICD-10-CM | POA: Diagnosis not present

## 2015-09-22 DIAGNOSIS — K5190 Ulcerative colitis, unspecified, without complications: Secondary | ICD-10-CM | POA: Diagnosis not present

## 2015-11-03 DIAGNOSIS — F4321 Adjustment disorder with depressed mood: Secondary | ICD-10-CM | POA: Diagnosis not present

## 2015-11-03 DIAGNOSIS — F5102 Adjustment insomnia: Secondary | ICD-10-CM | POA: Diagnosis not present

## 2015-11-03 MED FILL — traZODone HCL 50 MG TABS: 50 | 30 days supply | Qty: 30 | Fill #0

## 2015-11-28 MED FILL — traZODone HCL 50 MG TABS: 50 | 30 days supply | Qty: 30 | Fill #1

## 2015-11-28 MED FILL — PROPRANOLOL 10 MG TABLET: 10 | 3 days supply | Qty: 15 | Fill #1

## 2015-12-07 DIAGNOSIS — F330 Major depressive disorder, recurrent, mild: Secondary | ICD-10-CM | POA: Diagnosis not present

## 2015-12-12 MED FILL — traZODone HCL 50 MG TABS: 50 | 30 days supply | Qty: 90 | Fill #0

## 2015-12-12 MED FILL — BUPROPION HCL XL 150 MG TAB: 150 | 30 days supply | Qty: 30 | Fill #0

## 2016-01-13 MED FILL — BUPROPION HCL XL 150 MG TAB: 150 | 30 days supply | Qty: 30 | Fill #1

## 2016-01-30 MED FILL — MESALAMINE DR 1.2 GM TABLET: 1.2 | 30 days supply | Qty: 120 | Fill #2

## 2016-02-14 MED FILL — BUPROPION HCL XL 150 MG TAB: 150 | 30 days supply | Qty: 30 | Fill #2

## 2016-02-16 DIAGNOSIS — D1801 Hemangioma of skin and subcutaneous tissue: Secondary | ICD-10-CM | POA: Diagnosis not present

## 2016-02-16 DIAGNOSIS — L814 Other melanin hyperpigmentation: Secondary | ICD-10-CM | POA: Diagnosis not present

## 2016-02-16 DIAGNOSIS — Z23 Encounter for immunization: Secondary | ICD-10-CM | POA: Diagnosis not present

## 2016-02-16 DIAGNOSIS — L821 Other seborrheic keratosis: Secondary | ICD-10-CM | POA: Diagnosis not present

## 2016-02-16 DIAGNOSIS — L853 Xerosis cutis: Secondary | ICD-10-CM | POA: Diagnosis not present

## 2016-02-16 DIAGNOSIS — D225 Melanocytic nevi of trunk: Secondary | ICD-10-CM | POA: Diagnosis not present

## 2016-02-22 MED FILL — traZODone HCL 50 MG TABS: 50 | 30 days supply | Qty: 90 | Fill #1

## 2016-02-23 DIAGNOSIS — Z01419 Encounter for gynecological examination (general) (routine) without abnormal findings: Secondary | ICD-10-CM | POA: Diagnosis not present

## 2016-02-23 DIAGNOSIS — Z130 Encounter for screening for diseases of the blood and blood-forming organs and certain disorders involving the immune mechanism: Secondary | ICD-10-CM | POA: Diagnosis not present

## 2016-02-23 DIAGNOSIS — Z114 Encounter for screening for human immunodeficiency virus [HIV]: Secondary | ICD-10-CM | POA: Diagnosis not present

## 2016-02-23 DIAGNOSIS — Z6827 Body mass index (BMI) 27.0-27.9, adult: Secondary | ICD-10-CM | POA: Diagnosis not present

## 2016-02-23 DIAGNOSIS — Z1321 Encounter for screening for nutritional disorder: Secondary | ICD-10-CM | POA: Diagnosis not present

## 2016-02-23 DIAGNOSIS — Z13220 Encounter for screening for lipoid disorders: Secondary | ICD-10-CM | POA: Diagnosis not present

## 2016-02-23 DIAGNOSIS — Z1231 Encounter for screening mammogram for malignant neoplasm of breast: Secondary | ICD-10-CM | POA: Diagnosis not present

## 2016-02-29 DIAGNOSIS — F330 Major depressive disorder, recurrent, mild: Secondary | ICD-10-CM | POA: Diagnosis not present

## 2016-03-01 DIAGNOSIS — Z13820 Encounter for screening for osteoporosis: Secondary | ICD-10-CM | POA: Diagnosis not present

## 2016-03-01 DIAGNOSIS — M8588 Other specified disorders of bone density and structure, other site: Secondary | ICD-10-CM | POA: Diagnosis not present

## 2016-03-12 MED FILL — BUPROPION HCL XL 150 MG TAB: 150 | 30 days supply | Qty: 30 | Fill #0

## 2016-03-22 ENCOUNTER — Telehealth: Payer: Self-pay

## 2016-03-22 MED ORDER — BENZONATATE 200 MG PO CAPS: 200.0000 mg | ORAL_CAPSULE | Freq: Three times a day (TID) | ORAL | 0 refills | Status: DC | PRN

## 2016-03-22 MED ORDER — AMOXICILLIN-POT CLAVULANATE 875-125 MG PO TABS: 1.0000 | ORAL_TABLET | Freq: Two times a day (BID) | ORAL | 0 refills | Status: DC

## 2016-03-22 MED ORDER — PREDNISONE 20 MG PO TABS: 20.0000 mg | ORAL_TABLET | Freq: Every day | ORAL | 0 refills | Status: DC

## 2016-03-22 MED FILL — BENZONATATE 200 MG CAPSULE: 200 | 10 days supply | Qty: 30 | Fill #0

## 2016-03-22 MED FILL — AMOX TR-K CLV 875-125 MG TA: 875-125 | 10 days supply | Qty: 20 | Fill #0

## 2016-03-22 MED FILL — predniSONE 20 MG TABS: 20 | 7 days supply | Qty: 21 | Fill #0

## 2016-03-22 NOTE — Telephone Encounter (Signed)
Dr. Raliegh Scarlet called stating that pt contacted her stating that she has had a sore throat, body aches, and fatigue.  Pt's mother fell and broke her arm and she is having to take care of her.  Pt also stated that she began running a fever, having yellow secretions from lungs and nasal,  left side of jaw and left sided face with onset yesterday.  Per Dr. Raliegh Scarlet, will prescribe Tessalon perles 200mg  #30 po TID, Augmentin 875mg  BID x 10 days, prednisone 20mg  3 tabs po daily x 7 days.  Also advised to try OTC Delsym.   Charyl Bigger, CMA

## 2016-04-02 MED FILL — traZODone HCL 50 MG TABS: 50 | 30 days supply | Qty: 90 | Fill #2

## 2016-04-10 MED FILL — BUPROPION HCL XL 150 MG TAB: 150 | 30 days supply | Qty: 30 | Fill #1

## 2016-05-01 DIAGNOSIS — K5190 Ulcerative colitis, unspecified, without complications: Secondary | ICD-10-CM | POA: Diagnosis not present

## 2016-05-01 DIAGNOSIS — H524 Presbyopia: Secondary | ICD-10-CM | POA: Diagnosis not present

## 2016-05-01 DIAGNOSIS — Z86010 Personal history of colonic polyps: Secondary | ICD-10-CM | POA: Diagnosis not present

## 2016-05-01 DIAGNOSIS — H52203 Unspecified astigmatism, bilateral: Secondary | ICD-10-CM | POA: Diagnosis not present

## 2016-05-01 LAB — HEPATIC FUNCTION PANEL
ALK PHOS: 66 U/L (ref 25–125)
ALT: 33 U/L (ref 7–35)
AST: 25 U/L (ref 13–35)
Bilirubin, Direct: 0.05 mg/dL (ref 0.01–0.4)
Bilirubin, Total: 0.2 mg/dL

## 2016-05-01 LAB — BASIC METABOLIC PANEL
BUN: 18 mg/dL (ref 4–21)
Creatinine: 0.8 mg/dL (ref 0.5–1.1)
GLUCOSE: 106 mg/dL
POTASSIUM: 4.2 mmol/L (ref 3.4–5.3)
Sodium: 141 mmol/L (ref 137–147)

## 2016-05-01 LAB — ESTIMATED GFR: EGFR: 84

## 2016-05-01 LAB — CALCIUM: Calcium: 9.5 mg/dL

## 2016-05-02 DIAGNOSIS — F330 Major depressive disorder, recurrent, mild: Secondary | ICD-10-CM | POA: Diagnosis not present

## 2016-05-02 MED FILL — LIALDA 1.2 GM TABLET SA: 1.2 | 30 days supply | Qty: 120 | Fill #0

## 2016-05-02 MED FILL — traZODone HCL 50 MG TABS: 50 | 30 days supply | Qty: 90 | Fill #0

## 2016-05-08 MED FILL — BUPROPION HCL XL 150 MG TAB: 150 | 30 days supply | Qty: 30 | Fill #0

## 2016-05-09 ENCOUNTER — Encounter: Payer: Self-pay | Admitting: Family Medicine

## 2016-06-07 DIAGNOSIS — D352 Benign neoplasm of pituitary gland: Secondary | ICD-10-CM | POA: Diagnosis not present

## 2016-06-07 DIAGNOSIS — Z0000 Encounter for general adult medical examination without abnormal findings: Secondary | ICD-10-CM | POA: Diagnosis not present

## 2016-06-07 DIAGNOSIS — K5190 Ulcerative colitis, unspecified, without complications: Secondary | ICD-10-CM | POA: Diagnosis not present

## 2016-06-08 DIAGNOSIS — Z0000 Encounter for general adult medical examination without abnormal findings: Secondary | ICD-10-CM | POA: Diagnosis not present

## 2016-06-27 MED FILL — BUPROPION HCL XL 150 MG TAB: 150 | 30 days supply | Qty: 30 | Fill #1

## 2016-06-27 MED FILL — traZODone HCL 50 MG TABS: 50 | 30 days supply | Qty: 90 | Fill #1

## 2016-08-08 MED FILL — BUPROPION XL 150 MG TAB: 150 | 30 days supply | Qty: 30 | Fill #2

## 2016-08-08 MED FILL — traZODone HCL 50 MG TABS: 50 | 30 days supply | Qty: 90 | Fill #2

## 2016-10-02 DIAGNOSIS — N762 Acute vulvitis: Secondary | ICD-10-CM | POA: Diagnosis not present

## 2016-10-02 MED FILL — traZODone HCL 50 MG TABS: 50 | 30 days supply | Qty: 90 | Fill #3

## 2016-10-02 MED FILL — CLOBETASOL 0.05% CREAM: 0.05 | 14 days supply | Qty: 30 | Fill #0

## 2016-10-02 MED FILL — BUPROPION XL 150 MG TAB: 150 | 30 days supply | Qty: 30 | Fill #3

## 2016-10-03 MED FILL — GAVILYTE-G SOLUTION: 236 | 2 days supply | Qty: 4000 | Fill #0

## 2016-10-03 MED FILL — LIALDA 1.2 GM TABLET SA: 1.2 | 30 days supply | Qty: 120 | Fill #1

## 2016-10-08 DIAGNOSIS — K5520 Angiodysplasia of colon without hemorrhage: Secondary | ICD-10-CM | POA: Diagnosis not present

## 2016-10-08 DIAGNOSIS — K6289 Other specified diseases of anus and rectum: Secondary | ICD-10-CM | POA: Diagnosis not present

## 2016-10-08 DIAGNOSIS — Z1211 Encounter for screening for malignant neoplasm of colon: Secondary | ICD-10-CM | POA: Diagnosis not present

## 2016-10-08 DIAGNOSIS — K5190 Ulcerative colitis, unspecified, without complications: Secondary | ICD-10-CM | POA: Diagnosis not present

## 2016-10-12 MED FILL — CANASA 1,000 MG SUPPOSITORY: 1000 | 30 days supply | Qty: 30 | Fill #0

## 2016-10-19 DIAGNOSIS — N898 Other specified noninflammatory disorders of vagina: Secondary | ICD-10-CM | POA: Diagnosis not present

## 2016-10-30 ENCOUNTER — Other Ambulatory Visit: Payer: Self-pay | Admitting: *Deleted

## 2016-10-30 DIAGNOSIS — Z8249 Family history of ischemic heart disease and other diseases of the circulatory system: Secondary | ICD-10-CM

## 2016-10-30 NOTE — Progress Notes (Signed)
Patient was here in office with her mother who was seeing Dr Rockey Situ today. Dr Rockey Situ requested for this patient have CT calcium score ordered for her. Phone number to [address] given to patient to call and schedule at her convenience.

## 2016-11-14 DIAGNOSIS — F330 Major depressive disorder, recurrent, mild: Secondary | ICD-10-CM | POA: Diagnosis not present

## 2016-11-16 ENCOUNTER — Ambulatory Visit: Payer: Self-pay | Admitting: Family Medicine

## 2016-11-16 MED FILL — BUPROPION HCL XL 150 MG TAB: 150 | 30 days supply | Qty: 30 | Fill #4

## 2016-11-16 MED FILL — traZODone HCL 50 MG TABS: 50 | 30 days supply | Qty: 90 | Fill #4

## 2016-12-20 MED FILL — traZODone HCL 50 MG TABS: 50 | 30 days supply | Qty: 90 | Fill #5

## 2017-01-10 MED FILL — LIALDA 1.2 GM TABLET SA: 1.2 | 30 days supply | Qty: 120 | Fill #2

## 2017-01-10 MED FILL — buPROPion HCL ER (XL) 150 M: 150 | 30 days supply | Qty: 30 | Fill #5

## 2017-01-22 MED FILL — traZODone HCL 50 MG TABS: 50 | 30 days supply | Qty: 90 | Fill #0

## 2017-01-23 DIAGNOSIS — J029 Acute pharyngitis, unspecified: Secondary | ICD-10-CM | POA: Diagnosis not present

## 2017-02-21 DIAGNOSIS — D1801 Hemangioma of skin and subcutaneous tissue: Secondary | ICD-10-CM | POA: Diagnosis not present

## 2017-02-21 DIAGNOSIS — L821 Other seborrheic keratosis: Secondary | ICD-10-CM | POA: Diagnosis not present

## 2017-02-21 DIAGNOSIS — L816 Other disorders of diminished melanin formation: Secondary | ICD-10-CM | POA: Diagnosis not present

## 2017-02-21 DIAGNOSIS — L814 Other melanin hyperpigmentation: Secondary | ICD-10-CM | POA: Diagnosis not present

## 2017-02-21 DIAGNOSIS — Z23 Encounter for immunization: Secondary | ICD-10-CM | POA: Diagnosis not present

## 2017-02-21 DIAGNOSIS — D225 Melanocytic nevi of trunk: Secondary | ICD-10-CM | POA: Diagnosis not present

## 2017-02-21 DIAGNOSIS — D220 Melanocytic nevi of lip: Secondary | ICD-10-CM | POA: Diagnosis not present

## 2017-02-21 DIAGNOSIS — L818 Other specified disorders of pigmentation: Secondary | ICD-10-CM | POA: Diagnosis not present

## 2017-03-04 MED FILL — traZODone HCL 50 MG TABS: 50 | 30 days supply | Qty: 90 | Fill #1

## 2017-03-12 DIAGNOSIS — Z6379 Other stressful life events affecting family and household: Secondary | ICD-10-CM | POA: Diagnosis not present

## 2017-03-12 DIAGNOSIS — R05 Cough: Secondary | ICD-10-CM | POA: Diagnosis not present

## 2017-03-12 DIAGNOSIS — J9801 Acute bronchospasm: Secondary | ICD-10-CM | POA: Diagnosis not present

## 2017-03-12 DIAGNOSIS — J209 Acute bronchitis, unspecified: Secondary | ICD-10-CM | POA: Diagnosis not present

## 2017-03-12 MED FILL — HYDROCODONE-CHLORPHENIRAM S: 10-8 | 10 days supply | Qty: 100 | Fill #0

## 2017-03-12 MED FILL — DOXYCYCLINE MONOHYDRATE 100: 100 | 10 days supply | Qty: 20 | Fill #0

## 2017-03-12 MED FILL — predniSONE 20 MG TABS: 20 | 6 days supply | Qty: 9 | Fill #0

## 2017-03-21 MED FILL — buPROPion HCL ER (XL) 150 M: 150 | 30 days supply | Qty: 30 | Fill #0

## 2017-03-27 DIAGNOSIS — R0981 Nasal congestion: Secondary | ICD-10-CM | POA: Diagnosis not present

## 2017-03-27 DIAGNOSIS — J0100 Acute maxillary sinusitis, unspecified: Secondary | ICD-10-CM | POA: Diagnosis not present

## 2017-03-27 DIAGNOSIS — J9801 Acute bronchospasm: Secondary | ICD-10-CM | POA: Diagnosis not present

## 2017-03-27 DIAGNOSIS — R05 Cough: Secondary | ICD-10-CM | POA: Diagnosis not present

## 2017-03-27 MED FILL — VENTOLIN HFA 90 MCG INHALER: 108 (90 BAS | 30 days supply | Qty: 18 | Fill #0

## 2017-04-03 DIAGNOSIS — Z1231 Encounter for screening mammogram for malignant neoplasm of breast: Secondary | ICD-10-CM | POA: Diagnosis not present

## 2017-04-08 DIAGNOSIS — Z6828 Body mass index (BMI) 28.0-28.9, adult: Secondary | ICD-10-CM | POA: Diagnosis not present

## 2017-04-08 DIAGNOSIS — Z01419 Encounter for gynecological examination (general) (routine) without abnormal findings: Secondary | ICD-10-CM | POA: Diagnosis not present

## 2017-04-09 ENCOUNTER — Ambulatory Visit
Admission: RE | Admit: 2017-04-09 | Discharge: 2017-04-09 | Disposition: A | Discharge status: Home / Self Care | Payer: 59 | Source: Ambulatory Visit | Attending: Internal Medicine | Admitting: Internal Medicine

## 2017-04-09 ENCOUNTER — Other Ambulatory Visit: Payer: Self-pay | Admitting: Internal Medicine

## 2017-04-09 DIAGNOSIS — Z130 Encounter for screening for diseases of the blood and blood-forming organs and certain disorders involving the immune mechanism: Secondary | ICD-10-CM | POA: Diagnosis not present

## 2017-04-09 DIAGNOSIS — R059 Cough, unspecified: Secondary | ICD-10-CM

## 2017-04-09 DIAGNOSIS — R05 Cough: Secondary | ICD-10-CM | POA: Diagnosis not present

## 2017-04-09 DIAGNOSIS — Z13220 Encounter for screening for lipoid disorders: Secondary | ICD-10-CM | POA: Diagnosis not present

## 2017-04-09 DIAGNOSIS — Z1329 Encounter for screening for other suspected endocrine disorder: Secondary | ICD-10-CM | POA: Diagnosis not present

## 2017-04-09 DIAGNOSIS — Z1321 Encounter for screening for nutritional disorder: Secondary | ICD-10-CM | POA: Diagnosis not present

## 2017-04-09 DIAGNOSIS — J4521 Mild intermittent asthma with (acute) exacerbation: Secondary | ICD-10-CM | POA: Diagnosis not present

## 2017-04-09 DIAGNOSIS — Z131 Encounter for screening for diabetes mellitus: Secondary | ICD-10-CM | POA: Diagnosis not present

## 2017-04-09 DIAGNOSIS — Z13228 Encounter for screening for other metabolic disorders: Secondary | ICD-10-CM | POA: Diagnosis not present

## 2017-04-09 DIAGNOSIS — R0789 Other chest pain: Secondary | ICD-10-CM | POA: Diagnosis not present

## 2017-04-10 MED FILL — traZODone HCL 50 MG TABS: 50 | 30 days supply | Qty: 90 | Fill #2

## 2017-04-10 MED FILL — HYDROCODONE-CHLORPHENIRAM S: 10-8 | 10 days supply | Qty: 100 | Fill #0

## 2017-04-10 MED FILL — predniSONE 20 MG TABS: 20 | 9 days supply | Qty: 18 | Fill #0

## 2017-04-12 MED FILL — LIALDA 1.2 GM TABLET SA: 1.2 | 30 days supply | Qty: 120 | Fill #3

## 2017-05-08 ENCOUNTER — Ambulatory Visit (INDEPENDENT_AMBULATORY_CARE_PROVIDER_SITE_OTHER)
Admission: RE | Admit: 2017-05-08 | Discharge: 2017-05-08 | Disposition: A | Discharge status: Home / Self Care | Payer: Self-pay | Source: Ambulatory Visit | Attending: Cardiovascular Disease | Admitting: Cardiovascular Disease

## 2017-05-08 DIAGNOSIS — Z8249 Family history of ischemic heart disease and other diseases of the circulatory system: Secondary | ICD-10-CM

## 2017-05-10 MED FILL — buPROPion HCL ER (XL) 150 M: 150 | 30 days supply | Qty: 30 | Fill #1

## 2017-05-15 DIAGNOSIS — J301 Allergic rhinitis due to pollen: Secondary | ICD-10-CM | POA: Diagnosis not present

## 2017-05-15 DIAGNOSIS — J3089 Other allergic rhinitis: Secondary | ICD-10-CM | POA: Diagnosis not present

## 2017-05-15 DIAGNOSIS — J3081 Allergic rhinitis due to animal (cat) (dog) hair and dander: Secondary | ICD-10-CM | POA: Diagnosis not present

## 2017-05-15 DIAGNOSIS — R05 Cough: Secondary | ICD-10-CM | POA: Diagnosis not present

## 2017-05-15 MED FILL — ARNUITY ELLIPTA 100 MCG INH: 100 | 30 days supply | Qty: 30 | Fill #0

## 2017-05-15 MED FILL — traZODone HCL 50 MG TABS: 50 | 30 days supply | Qty: 90 | Fill #3

## 2017-05-16 ENCOUNTER — Telehealth: Payer: Self-pay | Admitting: Cardiovascular Disease

## 2017-05-16 NOTE — Telephone Encounter (Signed)
Please call to discuss calcium score results.

## 2017-05-16 NOTE — Telephone Encounter (Signed)
S/w patient and let her know once Dr Rockey Situ reviews the CT we will let her know his recommendations. She was very Patent attorney.

## 2017-06-04 DIAGNOSIS — D443 Neoplasm of uncertain behavior of pituitary gland: Secondary | ICD-10-CM | POA: Diagnosis not present

## 2017-06-05 MED FILL — OSELTAMIVIR PHOSPHATE 75 MG: 75 | 10 days supply | Qty: 10 | Fill #0

## 2017-06-18 MED FILL — traZODone HCL 50 MG TABS: 50 | 30 days supply | Qty: 90 | Fill #4

## 2017-06-24 MED FILL — buPROPion HCL ER (XL) 150 M: 150 | 30 days supply | Qty: 30 | Fill #0

## 2017-06-26 DIAGNOSIS — H52203 Unspecified astigmatism, bilateral: Secondary | ICD-10-CM | POA: Diagnosis not present

## 2017-06-26 DIAGNOSIS — H524 Presbyopia: Secondary | ICD-10-CM | POA: Diagnosis not present

## 2017-06-26 DIAGNOSIS — H5213 Myopia, bilateral: Secondary | ICD-10-CM | POA: Diagnosis not present

## 2017-06-27 DIAGNOSIS — F330 Major depressive disorder, recurrent, mild: Secondary | ICD-10-CM | POA: Diagnosis not present

## 2017-07-17 MED FILL — LIALDA 1.2 GM TABLET SA: 1.2 | 30 days supply | Qty: 120 | Fill #0

## 2017-07-17 MED FILL — traZODone HCL 50 MG TABS: 50 | 30 days supply | Qty: 90 | Fill #5

## 2017-08-05 ENCOUNTER — Other Ambulatory Visit: Payer: Self-pay | Admitting: Internal Medicine

## 2017-08-05 ENCOUNTER — Other Ambulatory Visit: Payer: 59

## 2017-08-05 ENCOUNTER — Ambulatory Visit
Admission: RE | Admit: 2017-08-05 | Discharge: 2017-08-05 | Disposition: A | Discharge status: Home / Self Care | Payer: 59 | Source: Ambulatory Visit | Attending: Internal Medicine | Admitting: Internal Medicine

## 2017-08-05 DIAGNOSIS — J329 Chronic sinusitis, unspecified: Secondary | ICD-10-CM

## 2017-08-05 DIAGNOSIS — R05 Cough: Secondary | ICD-10-CM | POA: Diagnosis not present

## 2017-08-05 DIAGNOSIS — J321 Chronic frontal sinusitis: Secondary | ICD-10-CM | POA: Diagnosis not present

## 2017-08-05 DIAGNOSIS — J320 Chronic maxillary sinusitis: Secondary | ICD-10-CM | POA: Diagnosis not present

## 2017-08-05 DIAGNOSIS — R52 Pain, unspecified: Secondary | ICD-10-CM | POA: Diagnosis not present

## 2017-08-05 MED FILL — predniSONE 20 MG TABS: 20 | 6 days supply | Qty: 9 | Fill #0

## 2017-08-08 DIAGNOSIS — J320 Chronic maxillary sinusitis: Secondary | ICD-10-CM | POA: Diagnosis not present

## 2017-08-08 DIAGNOSIS — J4521 Mild intermittent asthma with (acute) exacerbation: Secondary | ICD-10-CM | POA: Diagnosis not present

## 2017-08-08 MED FILL — AZITHROMYCIN 250 MG TABS: 250 | 5 days supply | Qty: 6 | Fill #0

## 2017-08-20 DIAGNOSIS — K219 Gastro-esophageal reflux disease without esophagitis: Secondary | ICD-10-CM | POA: Insufficient documentation

## 2017-08-20 DIAGNOSIS — J341 Cyst and mucocele of nose and nasal sinus: Secondary | ICD-10-CM | POA: Diagnosis not present

## 2017-08-20 DIAGNOSIS — J45909 Unspecified asthma, uncomplicated: Secondary | ICD-10-CM | POA: Diagnosis not present

## 2017-08-23 MED FILL — traZODone HCL 50 MG TABS: 50 | 30 days supply | Qty: 90 | Fill #0

## 2017-09-25 MED FILL — traZODone HCL 50 MG TABS: 50 | 30 days supply | Qty: 90 | Fill #1

## 2017-09-25 MED FILL — LIALDA 1.2 GM TABLET SA: 1.2 | 30 days supply | Qty: 120 | Fill #0

## 2017-09-25 MED FILL — MESALAMINE 1000 MG SUPP: 1000 | 30 days supply | Qty: 30 | Fill #1

## 2017-11-06 MED FILL — traZODone HCL 50 MG TABS: 50 | 30 days supply | Qty: 90 | Fill #2

## 2017-11-21 DIAGNOSIS — K5190 Ulcerative colitis, unspecified, without complications: Secondary | ICD-10-CM | POA: Diagnosis not present

## 2017-11-21 DIAGNOSIS — Z86010 Personal history of colonic polyps: Secondary | ICD-10-CM | POA: Diagnosis not present

## 2017-11-25 DIAGNOSIS — M25521 Pain in right elbow: Secondary | ICD-10-CM | POA: Diagnosis not present

## 2017-11-28 MED FILL — LIALDA 1.2 GM TABLET SA: 1.2 | 30 days supply | Qty: 120 | Fill #0

## 2017-12-20 DIAGNOSIS — M25521 Pain in right elbow: Secondary | ICD-10-CM | POA: Diagnosis not present

## 2017-12-24 MED FILL — traZODone HCL 50 MG TABS: 50 | 30 days supply | Qty: 90 | Fill #3

## 2018-01-07 DIAGNOSIS — F330 Major depressive disorder, recurrent, mild: Secondary | ICD-10-CM | POA: Diagnosis not present

## 2018-01-07 DIAGNOSIS — G4700 Insomnia, unspecified: Secondary | ICD-10-CM | POA: Diagnosis not present

## 2018-01-29 MED FILL — traZODone HCL 50 MG TABS: 50 | 90 days supply | Qty: 270 | Fill #0

## 2018-02-24 DIAGNOSIS — M25521 Pain in right elbow: Secondary | ICD-10-CM | POA: Diagnosis not present

## 2018-02-25 ENCOUNTER — Other Ambulatory Visit: Payer: Self-pay | Admitting: Orthopaedic Surgery

## 2018-02-25 DIAGNOSIS — M25521 Pain in right elbow: Secondary | ICD-10-CM

## 2018-02-26 MED FILL — LIALDA 1.2 GM TABLET SA: 1.2 | 30 days supply | Qty: 120 | Fill #1

## 2018-03-08 ENCOUNTER — Ambulatory Visit
Admission: RE | Admit: 2018-03-08 | Discharge: 2018-03-08 | Disposition: A | Discharge status: Home / Self Care | Payer: 59 | Source: Ambulatory Visit | Attending: Orthopaedic Surgery | Admitting: Orthopaedic Surgery

## 2018-03-08 DIAGNOSIS — M25521 Pain in right elbow: Secondary | ICD-10-CM

## 2018-03-08 DIAGNOSIS — S56512A Strain of other extensor muscle, fascia and tendon at forearm level, left arm, initial encounter: Secondary | ICD-10-CM | POA: Diagnosis not present

## 2018-03-10 DIAGNOSIS — L905 Scar conditions and fibrosis of skin: Secondary | ICD-10-CM | POA: Diagnosis not present

## 2018-03-10 DIAGNOSIS — Z23 Encounter for immunization: Secondary | ICD-10-CM | POA: Diagnosis not present

## 2018-03-10 DIAGNOSIS — L821 Other seborrheic keratosis: Secondary | ICD-10-CM | POA: Diagnosis not present

## 2018-03-10 DIAGNOSIS — D1801 Hemangioma of skin and subcutaneous tissue: Secondary | ICD-10-CM | POA: Diagnosis not present

## 2018-03-10 DIAGNOSIS — D485 Neoplasm of uncertain behavior of skin: Secondary | ICD-10-CM | POA: Diagnosis not present

## 2018-03-10 DIAGNOSIS — L570 Actinic keratosis: Secondary | ICD-10-CM | POA: Diagnosis not present

## 2018-03-11 DIAGNOSIS — Z0000 Encounter for general adult medical examination without abnormal findings: Secondary | ICD-10-CM | POA: Diagnosis not present

## 2018-03-11 DIAGNOSIS — D352 Benign neoplasm of pituitary gland: Secondary | ICD-10-CM | POA: Diagnosis not present

## 2018-03-11 DIAGNOSIS — Z119 Encounter for screening for infectious and parasitic diseases, unspecified: Secondary | ICD-10-CM | POA: Diagnosis not present

## 2018-03-11 DIAGNOSIS — M778 Other enthesopathies, not elsewhere classified: Secondary | ICD-10-CM | POA: Diagnosis not present

## 2018-03-11 DIAGNOSIS — L989 Disorder of the skin and subcutaneous tissue, unspecified: Secondary | ICD-10-CM | POA: Diagnosis not present

## 2018-03-11 DIAGNOSIS — N809 Endometriosis, unspecified: Secondary | ICD-10-CM | POA: Diagnosis not present

## 2018-03-11 DIAGNOSIS — K5190 Ulcerative colitis, unspecified, without complications: Secondary | ICD-10-CM | POA: Diagnosis not present

## 2018-03-11 DIAGNOSIS — E559 Vitamin D deficiency, unspecified: Secondary | ICD-10-CM | POA: Diagnosis not present

## 2018-03-12 DIAGNOSIS — M25521 Pain in right elbow: Secondary | ICD-10-CM | POA: Diagnosis not present

## 2018-03-13 ENCOUNTER — Encounter: Payer: Self-pay | Admitting: Physical Therapy

## 2018-03-13 ENCOUNTER — Other Ambulatory Visit: Payer: Self-pay

## 2018-03-13 ENCOUNTER — Ambulatory Visit: Payer: 59 | Attending: Family Medicine | Admitting: Physical Therapy

## 2018-03-13 DIAGNOSIS — M25521 Pain in right elbow: Secondary | ICD-10-CM | POA: Insufficient documentation

## 2018-03-13 NOTE — Therapy (Signed)
[place], [place], [address] Phone: [phone]   Fax:  [phone]  Physical Therapy Evaluation  Patient Details  Name: [name] MRN: [id number] Date of Birth: 05-Feb-1961 Referring Provider (PT): Melrose Nakayama, Md   Encounter Date: 03/13/2018  PT End of Session - 03/13/18 1754    Visit Number  1    Number of Visits  13    Date for PT Re-Evaluation  04/25/18    Authorization Type  [facility] [employer]    PT Start Time  1332    PT Stop Time  1415    PT Time Calculation (min)  43 min    Activity Tolerance  Patient tolerated treatment well    Behavior During Therapy  [facility] for tasks assessed/performed       History reviewed. No pertinent past medical history.  Past Surgical History:  Procedure Laterality Date  . APPENDECTOMY    . CHOLECYSTECTOMY    . ENDOMETRIAL ABLATION    . LAPAROTOMY    . laproscopic kne    . TONSILLECTOMY      There were no vitals filed for this visit.   Subjective Assessment - 03/13/18 1337    Subjective  plays tennis. MRI confirmed extensor tear. plays piano. Steriod injections yesterday and end of august.     Patient Stated Goals  tennis, piano    Currently in Pain?  No/denies    Aggravating Factors   brushing teeth, tennis    Pain Relieving Factors  rest         [facility] PT Assessment - 03/13/18 0001      Assessment   Medical Diagnosis  Rt lateral epicondylitis    Referring Provider (PT)  Melrose Nakayama, Md    Onset Date/Surgical Date  --   chronic   Hand Dominance  Right    Prior Therapy  no      Precautions   Precautions  None      Restrictions   Weight Bearing Restrictions  No      Balance Screen   Has the patient fallen in the past 6 months  No      [place] residence    Living Arrangements  Children      Prior Function   Level of Independence  Independent    Vocation  Full time employment    Vocation  Requirements  physician liason      Cognition   Overall Cognitive Status  Within Functional Limits for tasks assessed      Sensation   Additional Comments  WFL      ROM / Strength   AROM / PROM / Strength  Strength      Strength   Overall Strength Comments  pain with 3-5 finger ext      Palpation   Palpation comment  TTP mid muscle belly                Objective measurements completed on examination: See above findings.              PT Education - 03/13/18 1754    Education Details  anatomy of condition, POC, HEP, exercise form/rationale, bracing    Person(s) Educated  Patient    Methods  Explanation;Demonstration;Tactile cues;Verbal cues;Handout    Comprehension  Verbalized understanding;Returned demonstration;Verbal cues required;Tactile cues required;Need further instruction          PT Long Term Goals - 03/13/18 1721  PT LONG TERM GOAL #1   Title  Pt will be able to begin return to tennis with light volley    Baseline  unable at eval due to pain    Time  6    Period  Weeks    Status  New    Target Date  04/25/18      PT LONG TERM GOAL #2   Title  Pt will be able to play piano without limitation by elbow pain    Baseline  some soreness prior to injection    Time  6    Period  Weeks    Status  New    Target Date  04/25/18      PT LONG TERM GOAL #3   Title  Pt will be able to perform all self care activities without pain    Baseline  pain with brushing teeth at eval    Time  6    Period  Weeks    Status  New    Target Date  04/25/18      PT LONG TERM GOAL #4   Title  pt will perform strong finger extension without discomfort    Baseline  pain at eval    Time  6    Period  Weeks    Status  New    Target Date  04/25/18             Plan - 03/13/18 1648    Clinical Impression Statement  Pt presents to PT with complaints of Rt lateral elbow pain that is chronic. Injections in Aug and just yesterday with MRI confirming  tearing from lateral epicondyle. She is an avid Firefighter and plays piano daily. weakness in finger extension with discomfort. pain found in palpation to trigger points im extensor group. Discussed use of tennis elbow compression braces and wrist brace to decrease flexion at night. Is interested in TPDN which will be done at next visit.     Clinical Presentation  Stable    Clinical Decision Making  Low    Rehab Potential  Good    PT Frequency  2x / week    PT Duration  6 weeks    PT Treatment/Interventions  ADLs/Self Care Home Management;Cryotherapy;Electrical Stimulation;Ultrasound;Moist Heat;Iontophoresis 4mg /ml Dexamethasone;Therapeutic activities;Therapeutic exercise;Manual techniques;Patient/family education;Passive range of motion;Dry needling;Taping    PT Next Visit Plan  TPDN, eccentrics    PT Home Exercise Plan  eccentric flexion, flexion stretch, STM roller    Consulted and Agree with Plan of Care  Patient       Patient will benefit from skilled therapeutic intervention in order to improve the following deficits and impairments:  Impaired UE functional use, Increased muscle spasms, Decreased activity tolerance, Pain, Decreased strength  Visit Diagnosis: Pain in right elbow - Plan: PT plan of care cert/re-cert     Problem List Patient Active Problem List   Diagnosis Date Noted  . Ulcerative colitis ([place]) 09/21/2015  . Overweight (BMI 25.0-29.9) 09/21/2015  . Cellulitis and abscess 09/21/2015  . Lymphocytic colitis 10/17/2011  . Depression, postpartum 10/17/2011    Gay Filler. Korbyn Vanes PT, DPT 03/13/18 5:59 PM   [facility] [address] [place], [place], [address] Phone: [phone]   Fax:  [phone]  Name: [name] MRN: [id number] Date of Birth: 10/08/1960

## 2018-03-14 DIAGNOSIS — N898 Other specified noninflammatory disorders of vagina: Secondary | ICD-10-CM | POA: Diagnosis not present

## 2018-03-14 DIAGNOSIS — N760 Acute vaginitis: Secondary | ICD-10-CM | POA: Diagnosis not present

## 2018-03-14 MED FILL — metroNIDAZOLE 0.75 % GEL: 0.75 | 5 days supply | Qty: 70 | Fill #0

## 2018-03-17 ENCOUNTER — Encounter: Payer: Self-pay | Admitting: Physical Therapy

## 2018-03-17 ENCOUNTER — Ambulatory Visit: Payer: 59 | Admitting: Physical Therapy

## 2018-03-17 DIAGNOSIS — M25521 Pain in right elbow: Secondary | ICD-10-CM

## 2018-03-17 NOTE — Patient Instructions (Addendum)
Trigger Point Dry Needling  . What is Trigger Point Dry Needling (DN)? o DN is a physical therapy technique used to treat muscle pain and dysfunction. Specifically, DN helps deactivate muscle trigger points (muscle knots).  o A thin filiform needle is used to penetrate the skin and stimulate the underlying trigger point. The goal is for a local twitch response (LTR) to occur and for the trigger point to relax. No medication of any kind is injected during the procedure.   . What Does Trigger Point Dry Needling Feel Like?  o The procedure feels different for each individual patient. Some patients report that they do not actually feel the needle enter the skin and overall the process is not painful. Very mild bleeding may occur. However, many patients feel a deep cramping in the muscle in which the needle was inserted. This is the local twitch response.   Marland Kitchen How Will I feel after the treatment? o Soreness is normal, and the onset of soreness may not occur for a few hours. Typically this soreness does not last longer than two days.  o Bruising is uncommon, however; ice can be used to decrease any possible bruising.  o In rare cases feeling tired or nauseous after the treatment is normal. In addition, your symptoms may get worse before they get better, this period will typically not last longer than 24 hours.   . What Can I do After My Treatment? o Increase your hydration by drinking more water for the next 24 hours. o You may place ice or heat on the areas treated that have become sore, however, do not use heat on inflamed or bruised areas. Heat often brings more relief post needling. o You can continue your regular activities, but vigorous activity is not recommended initially after the treatment for 24 hours. o DN is best combined with other physical therapy such as strengthening, stretching, and other therapies.   Wrist Extension: Resisted    With right palm down, _1-2__ pound weight in hand,  bend wrist up ( count of 2). Return slowly( count of 6). Repeat __10-20__ times per set. Do __1 sets per session. Do ___1 sessions per day.    Wrist Extensor Stretch    Keeping elbow straight, grasp left hand and slowly bend wrist forward until stretch is felt. Hold __20-30__ seconds. Relax. Repeat __2-3__ times per set. Do _1__ sets per session. Do __3-4__ sessions per day.

## 2018-03-17 NOTE — Therapy (Signed)
[place], [place], [address] Phone: [phone]   Fax:  [phone]  Physical Therapy Treatment  Patient Details  Name: [name] MRN: [id number] Date of Birth: Mar 16, 1961 Referring Provider (PT): Melrose Nakayama, Md   Encounter Date: 03/17/2018  PT End of Session - 03/17/18 0847    Visit Number  2    Number of Visits  13    Date for PT Re-Evaluation  04/25/18    Authorization Type  [facility] [employer]    PT Start Time  0847    PT Stop Time  0946    PT Time Calculation (min)  59 min    Activity Tolerance  Patient tolerated treatment well       History reviewed. No pertinent past medical history.  Past Surgical History:  Procedure Laterality Date  . APPENDECTOMY    . CHOLECYSTECTOMY    . ENDOMETRIAL ABLATION    . LAPAROTOMY    . laproscopic kne    . TONSILLECTOMY      There were no vitals filed for this visit.  Subjective Assessment - 03/17/18 0847    Subjective  Pt report she is doing her HEP and doesn't have pain today, she has been doing tennis ball STM also  (Pended)     Currently in Pain?  No/denies  (Pended)                        [facility] Adult PT Treatment/Exercise - 03/17/18 0001      Self-Care   Self-Care  Other Self-Care Comments    Other Self-Care Comments   discussed forearm strap and using it when she plays her instruments and when on the computer for pain       Exercises   Exercises  Elbow      Elbow Exercises   Elbow Extension  Strengthening;Right;15 reps;Seated;Bar weights/barbell   focus on eccentric   Bar Weights/Barbell (Elbow Extension)  2 lbs    Forearm Supination  Strengthening;Right;15 reps;Seated;Bar weights/barbell    Bar Weights/Barbell (Forearm Supination)  2 lbs    Forearm Pronation  Strengthening;Right;15 reps;Seated;Bar weights/barbell    Bar Weights/Barbell (Forearm Pronation)  2 lbs    Other elbow exercises  Rt wrist stretches       Modalities   Modalities  Electrical Stimulation;Moist Heat      Moist Heat Therapy   Number Minutes Moist Heat  15 Minutes    Moist Heat Location  --   Rt forearm     Electrical Stimulation   Electrical Stimulation Location  Rt forearm    Electrical Stimulation Action  IFC    Electrical Stimulation Parameters  to toleracne    Electrical Stimulation Goals  Pain;Tone      Manual Therapy   Manual Therapy  Soft tissue mobilization    Manual therapy comments  monitoring and STM while DN    Soft tissue mobilization  STM to Rt forearm with TPR       Trigger Point Dry Needling - 03/17/18 6122    Consent Given?  Yes    Education Handout Provided  Yes    Muscles Treated Upper Body  --   Rt forearm extensor mass - good twitch               PT Long Term Goals - 03/17/18 1017      PT LONG TERM GOAL #1   Title  Pt will be able to begin  return to tennis with light volley    Status  On-going      PT LONG TERM GOAL #2   Title  Pt will be able to play piano without limitation by elbow pain    Status  On-going      PT LONG TERM GOAL #3   Title  Pt will be able to perform all self care activities without pain    Status  On-going      PT LONG TERM GOAL #4   Title  pt will perform strong finger extension without discomfort    Status  On-going            Plan - 03/17/18 1016    Clinical Impression Statement  This is Honest's second visit, she had good response to DN and manual work with decreased palpable tightness. She did have post needling muscle soreness. Seng did experience some dizziness post needling, resolved with supine LE elevation and gingerale.  Add eccentric wrist work to her HEP.  No goals met at this time.      Rehab Potential  Good    PT Frequency  2x / week    PT Duration  6 weeks    PT Treatment/Interventions  ADLs/Self Care Home Management;Cryotherapy;Electrical Stimulation;Ultrasound;Moist Heat;Iontophoresis 52m/ml Dexamethasone;Therapeutic  activities;Therapeutic exercise;Manual techniques;Patient/family education;Passive range of motion;Dry needling;Taping    PT Next Visit Plan  TPDN, eccentrics, modaltities PRN    Consulted and Agree with Plan of Care  Patient       Patient will benefit from skilled therapeutic intervention in order to improve the following deficits and impairments:  Impaired UE functional use, Increased muscle spasms, Decreased activity tolerance, Pain, Decreased strength  Visit Diagnosis: Pain in right elbow     Problem List Patient Active Problem List   Diagnosis Date Noted  . Ulcerative colitis ([place] 09/21/2015  . Overweight (BMI 25.0-29.9) 09/21/2015  . Cellulitis and abscess 09/21/2015  . Lymphocytic colitis 10/17/2011  . Depression, postpartum 10/17/2011    SJeral PinchPT  03/17/2018, 10:20 AM  [facility] [address]Phone: [phone]  Fax:  [phone] Name: Yvette SKOLNICKMRN: [id number] of Birth: 930-Apr-1962

## 2018-03-19 ENCOUNTER — Ambulatory Visit: Payer: Self-pay | Admitting: Physical Therapy

## 2018-03-19 ENCOUNTER — Encounter: Payer: Self-pay | Admitting: Physical Therapy

## 2018-03-19 ENCOUNTER — Ambulatory Visit: Payer: 59 | Admitting: Physical Therapy

## 2018-03-19 DIAGNOSIS — M25521 Pain in right elbow: Secondary | ICD-10-CM | POA: Diagnosis not present

## 2018-03-19 NOTE — Therapy (Signed)
[place], [place], [address] Phone: [phone]   Fax:  [phone]  Physical Therapy Treatment  Patient Details  Name: [name] MRN: [id number] Date of Birth: Apr 19, 1961 Referring Provider (PT): Melrose Nakayama, Md   Encounter Date: 03/19/2018  PT End of Session - 03/19/18 3267    Visit Number  3    Number of Visits  13    Date for PT Re-Evaluation  04/25/18    Authorization Type  [facility]    PT Start Time  0925    PT Stop Time  1021    PT Time Calculation (min)  56 min    Activity Tolerance  Patient tolerated treatment well       History reviewed. No pertinent past medical history.  Past Surgical History:  Procedure Laterality Date  . APPENDECTOMY    . CHOLECYSTECTOMY    . ENDOMETRIAL ABLATION    . LAPAROTOMY    . laproscopic kne    . TONSILLECTOMY      There were no vitals filed for this visit.                    [facility] Adult PT Treatment/Exercise - 03/19/18 0001      Exercises   Exercises  Elbow      Elbow Exercises   Elbow Extension  Strengthening;Right;Seated;Bar weights/barbell;20 reps    Bar Weights/Barbell (Elbow Extension)  2 lbs    Forearm Supination  Strengthening;Right;15 reps;Seated;Bar weights/barbell    Bar Weights/Barbell (Forearm Supination)  2 lbs    Forearm Pronation  Strengthening;Right;15 reps;Seated;Bar weights/barbell    Bar Weights/Barbell (Forearm Pronation)  2 lbs    Other elbow exercises  shoulder horizontal abduction green band x15    Other elbow exercises  velcor roller with long handle x 10 reps       Modalities   Modalities  Ultrasound;Cryotherapy;Electrical Stimulation      Cryotherapy   Number Minutes Cryotherapy  15 Minutes    Cryotherapy Location  Forearm   Rt   Type of Cryotherapy  Ice pack      Electrical Stimulation   Electrical Stimulation Location  Rt forearm    Electrical Stimulation Action  IFC    Electrical  Stimulation Parameters  to tolerance    Electrical Stimulation Goals  Pain;Tone      Ultrasound   Ultrasound Location  Rt forearm - extensors    Ultrasound Parameters  50%, 1.33mHz, 1.0w/cm2    Ultrasound Goals  Pain      Manual Therapy   Manual Therapy  Soft tissue mobilization    Soft tissue mobilization  STM to Rt forearm - decreased palpable tightness and trigger points.                   PT Long Term Goals - 03/17/18 1017      PT LONG TERM GOAL #1   Title  Pt will be able to begin return to tennis with light volley    Status  On-going      PT LONG TERM GOAL #2   Title  Pt will be able to play piano without limitation by elbow pain    Status  On-going      PT LONG TERM GOAL #3   Title  Pt will be able to perform all self care activities without pain    Status  On-going      PT LONG TERM GOAL #4  Title  pt will perform strong finger extension without discomfort    Status  On-going            Plan - 03/19/18 1028    Clinical Impression Statement  [name] tolerated tx well, less palpable tightness and banding in the rt forearm.  She hasn't been about to perform her new exercises yet due to work and family coming into town.  She is planining on playing the piano over the holiday and is hopeful she will tolerate it well.     Rehab Potential  Good    PT Frequency  2x / week    PT Duration  6 weeks    PT Treatment/Interventions  ADLs/Self Care Home Management;Cryotherapy;Electrical Stimulation;Ultrasound;Moist Heat;Iontophoresis 4mg /ml Dexamethasone;Therapeutic activities;Therapeutic exercise;Manual techniques;Patient/family education;Passive range of motion;Dry needling;Taping    PT Next Visit Plan  TPDN, eccentrics, modaltities PRN    Consulted and Agree with Plan of Care  Patient       Patient will benefit from skilled therapeutic intervention in order to improve the following deficits and impairments:  Impaired UE functional use, Increased muscle spasms,  Decreased activity tolerance, Pain, Decreased strength  Visit Diagnosis: Pain in right elbow     Problem List Patient Active Problem List   Diagnosis Date Noted  . Ulcerative colitis ([place]) 09/21/2015  . Overweight (BMI 25.0-29.9) 09/21/2015  . Cellulitis and abscess 09/21/2015  . Lymphocytic colitis 10/17/2011  . Depression, postpartum 10/17/2011    Boneta Lucks rPT  03/19/2018, 10:30 AM  [facility] [address] [place], [place], [address] Phone: [phone]   Fax:  [phone]  Name: [name] MRN: [id number] Date of Birth: February 04, 1961

## 2018-03-20 ENCOUNTER — Encounter

## 2018-03-26 ENCOUNTER — Other Ambulatory Visit: Payer: Self-pay

## 2018-03-26 ENCOUNTER — Emergency Department (HOSPITAL_COMMUNITY)
Admission: EM | Admit: 2018-03-26 | Discharge: 2018-03-26 | Disposition: A | Discharge status: Home / Self Care | Payer: 59 | Attending: Emergency Medicine | Admitting: Emergency Medicine

## 2018-03-26 ENCOUNTER — Encounter (HOSPITAL_COMMUNITY): Payer: Self-pay | Admitting: Emergency Medicine

## 2018-03-26 DIAGNOSIS — R072 Precordial pain: Secondary | ICD-10-CM

## 2018-03-26 DIAGNOSIS — R079 Chest pain, unspecified: Secondary | ICD-10-CM | POA: Diagnosis not present

## 2018-03-26 DIAGNOSIS — Z79899 Other long term (current) drug therapy: Secondary | ICD-10-CM | POA: Diagnosis not present

## 2018-03-26 DIAGNOSIS — I309 Acute pericarditis, unspecified: Secondary | ICD-10-CM | POA: Insufficient documentation

## 2018-03-26 DIAGNOSIS — R0789 Other chest pain: Secondary | ICD-10-CM | POA: Diagnosis not present

## 2018-03-26 DIAGNOSIS — K859 Acute pancreatitis without necrosis or infection, unspecified: Secondary | ICD-10-CM | POA: Diagnosis not present

## 2018-03-26 LAB — CBC WITH DIFFERENTIAL/PLATELET
ABS IMMATURE GRANULOCYTES: 0.03 10*3/uL (ref 0.00–0.07)
BASOS PCT: 0 %
Basophils Absolute: 0 10*3/uL (ref 0.0–0.1)
EOS ABS: 0.1 10*3/uL (ref 0.0–0.5)
Eosinophils Relative: 1 %
HEMATOCRIT: 38.1 % (ref 36.0–46.0)
Hemoglobin: 11.9 g/dL — ABNORMAL LOW (ref 12.0–15.0)
IMMATURE GRANULOCYTES: 0 %
LYMPHS ABS: 1.8 10*3/uL (ref 0.7–4.0)
Lymphocytes Relative: 24 %
MCH: 28.1 pg (ref 26.0–34.0)
MCHC: 31.2 g/dL (ref 30.0–36.0)
MCV: 90.1 fL (ref 80.0–100.0)
MONO ABS: 0.6 10*3/uL (ref 0.1–1.0)
MONOS PCT: 8 %
NEUTROS ABS: 4.9 10*3/uL (ref 1.7–7.7)
NEUTROS PCT: 67 %
PLATELETS: 224 10*3/uL (ref 150–400)
RBC: 4.23 MIL/uL (ref 3.87–5.11)
RDW: 13.2 % (ref 11.5–15.5)
WBC: 7.4 10*3/uL (ref 4.0–10.5)
nRBC: 0 % (ref 0.0–0.2)

## 2018-03-26 LAB — BASIC METABOLIC PANEL
Anion gap: 9 (ref 5–15)
BUN: 12 mg/dL (ref 6–20)
CALCIUM: 9.1 mg/dL (ref 8.9–10.3)
CO2: 24 mmol/L (ref 22–32)
Chloride: 106 mmol/L (ref 98–111)
Creatinine, Ser: 0.77 mg/dL (ref 0.44–1.00)
GFR calc Af Amer: >60 mL/min (ref >60)
GLUCOSE: 98 mg/dL (ref 70–99)
Potassium: 3.9 mmol/L (ref 3.5–5.1)
Sodium: 139 mmol/L (ref 135–145)

## 2018-03-26 LAB — TROPONIN I
Troponin I: <0.03 ng/mL (ref <0.03)
Troponin I: <0.03 ng/mL (ref <0.03)

## 2018-03-26 LAB — BRAIN NATRIURETIC PEPTIDE: B Natriuretic Peptide: 74.7 pg/mL (ref 0.0–100.0)

## 2018-03-26 MED ORDER — DICYCLOMINE HCL 10 MG/5ML PO SOLN
10.0000 mg | Freq: Once | ORAL | Status: AC
  Administered 2018-03-26: 10 mg via ORAL
  Filled 2018-03-26 (×2): qty 5

## 2018-03-26 MED ORDER — NAPROXEN 250 MG PO TABS
500.0000 mg | ORAL_TABLET | Freq: Once | ORAL | Status: AC
  Administered 2018-03-26: 500 mg via ORAL
  Filled 2018-03-26: qty 2

## 2018-03-26 MED ORDER — OMEPRAZOLE 20 MG PO CPDR: 20.0000 mg | DELAYED_RELEASE_CAPSULE | Freq: Every day | ORAL | 0 refills | Status: DC

## 2018-03-26 MED ORDER — NAPROXEN 500 MG PO TABS: 500.0000 mg | ORAL_TABLET | Freq: Two times a day (BID) | ORAL | 0 refills | Status: DC

## 2018-03-26 MED ORDER — PREDNISONE 10 MG PO TABS: 50.0000 mg | ORAL_TABLET | Freq: Every day | ORAL | 0 refills | Status: DC

## 2018-03-26 NOTE — Discharge Instructions (Addendum)
We saw you in the ER for the chest pain/shortness of breath. All of our cardiac workup is normal, including labs, EKG and chest X-RAY are normal. We are not sure what is causing your discomfort, but think it might be pericarditis. We feel comfortable sending you home at this time. The workup in the ER is not complete, and you should follow up with your primary care doctor for further evaluation.  Please return to the ER if you have worsening chest pain, shortness of breath, pain radiating to your jaw, shoulder, or back, sweats or fainting. Otherwise see the Cardiologist as requested.

## 2018-03-26 NOTE — ED Triage Notes (Signed)
Pt BIB [facility] from PCP for chest pain and ?abnormal EKG. Intermittent chest pain x 3 days. Given 324mg  aspirin and 2 NTG PTA. NTG provided some relief in pain/tightness. EMS VSS

## 2018-03-26 NOTE — ED Provider Notes (Signed)
[facility] Provider Note   CSN: [id number] Arrival date & time: 03/26/18  1647     History   Chief Complaint Chief Complaint  Patient presents with  . Chest Pain    HPI [name] is a 57 y.o. female.  HPI  57 year old female comes in with chief complaint of chest pain. Patient has history of ulcerative colitis and she takes trazodone for sleep.  She reports that over the past couple of days she has had chest heaviness.  Her heaviness is localized to the midsternal and substernal region and is nonradiating.  Patient denies any associated shortness of breath, nausea, diaphoresis, dizziness.  Her pain is unprovoked and has become fairly constant today.  Pain is worse when she is laying down and it is better when she standing up.   Pt has no hx of PE, DVT and denies any exogenous hormone (testosterone / estrogen) use, long distance travels or surgery in the past 6 weeks, active cancer, recent immobilization.  She has family history of heart attack, however they were in family members who are older than 71.    History reviewed. No pertinent past medical history.  Patient Active Problem List   Diagnosis Date Noted  . Ulcerative colitis ([place]) 09/21/2015  . Overweight (BMI 25.0-29.9) 09/21/2015  . Cellulitis and abscess 09/21/2015  . Lymphocytic colitis 10/17/2011  . Depression, postpartum 10/17/2011    Past Surgical History:  Procedure Laterality Date  . APPENDECTOMY    . CHOLECYSTECTOMY    . ENDOMETRIAL ABLATION    . LAPAROTOMY    . laproscopic kne    . TONSILLECTOMY       OB History   None      Home Medications    Prior to Admission medications   Medication Sig Start Date End Date Taking? Authorizing Provider  amoxicillin-clavulanate (AUGMENTIN) 875-125 MG tablet Take 1 tablet by mouth 2 (two) times daily. Patient not taking: Reported on 03/13/2018 03/22/16   Mellody Dance, DO  benzonatate (TESSALON) 200 MG  capsule Take 1 capsule (200 mg total) by mouth 3 (three) times daily as needed for cough. Patient not taking: Reported on 03/13/2018 03/22/16   Mellody Dance, DO  buPROPion (WELLBUTRIN XL) 300 MG 24 hr tablet Take 300 mg by mouth daily.    [provider]  mesalamine (LIALDA) 1.2 g EC tablet Take 2 tablets by mouth daily with breakfast. 10/26/09   [provider]  Multiple Vitamin (MULTIVITAMIN) tablet Take 1 tablet by mouth daily.    [provider]  naproxen (NAPROSYN) 500 MG tablet Take 1 tablet (500 mg total) by mouth 2 (two) times daily. 03/26/18   Yvette Biles, MD  omeprazole (PRILOSEC) 20 MG capsule Take 1 capsule (20 mg total) by mouth daily. 03/26/18   Yvette Biles, MD  predniSONE (DELTASONE) 10 MG tablet Take 5 tablets (50 mg total) by mouth daily. 03/26/18   Yvette Biles, MD  Probiotic Product (PROBIOTIC DAILY PO) Take 1 tablet by mouth daily.    [provider]  sulfamethoxazole-trimethoprim (BACTRIM DS,SEPTRA DS) 800-160 MG tablet Take 1 tablet by mouth 2 (two) times daily. 09/14/15   [provider]  terconazole (TERAZOL 3) 0.8 % vaginal cream Place 1 applicator vaginally as needed. 01/13/10   [provider]  traZODone (DESYREL) 50 MG tablet Take 50 mg by mouth at bedtime as needed for sleep.    [provider]  VITAMIN D, CHOLECALCIFEROL, PO Take 1 tablet by mouth daily.  [provider]    Family History Family History  Problem Relation Age of Onset  . Goiter Mother   . Irregular heart beat Mother        Afibrillation  . Heart attack Mother   . Heart failure Father   . Thyroid disease Father   . Irregular heart beat Father        Afibrillation  . Heart disease Father   . Diabetes Brother     Social History Social History   Tobacco Use  . Smoking status: Never Smoker  . Smokeless tobacco: Never Used  Substance Use Topics  . Alcohol use: Yes  . Drug use: No     Allergies   Patient  has no known allergies.   Review of Systems Review of Systems  Constitutional: Positive for activity change.  Respiratory: Negative for shortness of breath.   Cardiovascular: Positive for chest pain.  Allergic/Immunologic: Negative for immunocompromised state.  Hematological: Does not bruise/bleed easily.  All other systems reviewed and are negative.    Physical Exam Updated Vital Signs BP 111/63 (BP Location: Right Arm)   Pulse 81   Resp 20   Ht 5\' 7"  (1.702 m)   Wt 83.9 kg   SpO2 98%   BMI 28.98 kg/m   Physical Exam  Constitutional: She is oriented to person, place, and time. She appears well-developed.  HENT:  Head: Normocephalic and atraumatic.  Eyes: EOM are normal.  Neck: Normal range of motion. Neck supple.  Cardiovascular: Normal rate, intact distal pulses and normal pulses.  Pulmonary/Chest: Effort normal. She has no decreased breath sounds. She has no wheezes. She has no rales.  Abdominal: Bowel sounds are normal.  Neurological: She is alert and oriented to person, place, and time.  Skin: Skin is warm and dry.  Nursing note and vitals reviewed.    ED Treatments / Results  Labs (all labs ordered are listed, but only abnormal results are displayed) Labs Reviewed  CBC WITH DIFFERENTIAL/PLATELET - Abnormal; Notable for the following components:      Result Value   Hemoglobin 11.9 (*)    All other components within normal limits  BASIC METABOLIC PANEL  TROPONIN I  BRAIN NATRIURETIC PEPTIDE  TROPONIN I    EKG EKG Interpretation  Date/Time:  Wednesday March 26 2018 16:51:25 EST Ventricular Rate:  73 PR Interval:    QRS Duration: 92 QT Interval:  392 QTC Calculation: 432 R Axis:   80 Text Interpretation:  Sinus rhythm No acute changes No old tracing to compare Confirmed by [name] [phone]) on 03/26/2018 6:11:35 PM   Radiology No results found.  Procedures Procedures (including critical care time)  Medications Ordered in  ED Medications  naproxen (NAPROSYN) tablet 500 mg (500 mg Oral Given 03/26/18 2207)  dicyclomine (BENTYL) 10 MG/5ML syrup 10 mg (10 mg Oral Given 03/26/18 2207)     Initial Impression / Assessment and Plan / ED Course  I have reviewed the triage vital signs and the nursing notes.  Pertinent labs & imaging results that were available during my care of the patient were reviewed by me and considered in my medical decision making (see chart for details).  Clinical Course as of Mar 27 113  Wed Mar 26, 2018  1946 Patient reassessed. Pt is comfortable at this time. She doesn't have chest pain right now.  Results of the workup discussed. Strict ER return precautions discussed. Follow up instruction discussed, and pt agrees with the plan and is comfortable with  it.     [AN]    Clinical Course User Index [AN] Yvette Biles, MD    57 year old female comes in with chief complaint of chest pain.  Patient is having chest heaviness off and on since past week.  Her symptoms were more constant today therefore she went to her PCP.  She was sent here because she was noted to have trigeminy.  Patient has history of ulcerative colitis and she takes trazodone.  She is on medication for UC that can cause myocarditis or pericarditis.  EKG is not showing any signs of myocarditis.  Troponin has been sent.  Although patient's pain is worse when she is laying and is better when she is standing up, she does not have any EKG changes that are consistent with pericarditis.  However, given that her medication can cause pericarditis, I suspect that she could be having mild pericarditis right now.  We are not concerned for PE based on history and exam.  Pain is been constant all day today and has some typical features.  Her heart score is 3 -1 each for age, risk factors and history. If her first troponin is negative, we will discharge her given that the pain is been present for several hours now.  She will have  cardiology follow-up with strict ER return precautions.  LATE ENTRY: After the first troponin patient was ready for discharge.  She stated that she still had mild chest discomfort and heaviness feeling.  We discussed the findings in the ED and low suspicion for ischemic cause for the pain.  I informed her that pericarditis appears to be more likely etiology.  After further discussion, patient agreed to is being monitored in the ED for longer duration and getting a delta troponin.  Delta troponin continued to stay negative therefore we discharge her.  She would return to the ER if her symptoms get worse.  She has been advised to follow-up with cardiology.  Final Clinical Impressions(s) / ED Diagnoses   Final diagnoses:  Precordial chest pain  Acute pericarditis, unspecified type    ED Discharge Orders         Ordered    naproxen (NAPROSYN) 500 MG tablet  2 times daily     03/26/18 1951    predniSONE (DELTASONE) 10 MG tablet  Daily     03/26/18 2009    omeprazole (PRILOSEC) 20 MG capsule  Daily     03/26/18 2232           Yvette Biles, MD 03/27/18 0115

## 2018-03-26 NOTE — ED Notes (Signed)
Note sent to pharmacy for bentyl

## 2018-03-27 ENCOUNTER — Ambulatory Visit: Payer: 59 | Admitting: Cardiology

## 2018-03-27 ENCOUNTER — Encounter: Payer: Self-pay | Admitting: Cardiology

## 2018-03-27 VITALS — BP 118/72 | HR 79 | Ht 67.0 in | Wt 183.0 lb

## 2018-03-27 DIAGNOSIS — R0789 Other chest pain: Secondary | ICD-10-CM | POA: Diagnosis not present

## 2018-03-27 MED FILL — predniSONE 10 MG TABS: 10 | 5 days supply | Qty: 25 | Fill #0

## 2018-03-27 MED FILL — NAPROXEN 500 MG TABLET: 500 | 15 days supply | Qty: 30 | Fill #0

## 2018-03-27 MED FILL — OMEPRAZOLE 20 MG CPDR: 20 | 30 days supply | Qty: 30 | Fill #0

## 2018-03-27 NOTE — Progress Notes (Signed)
Cardiology Office Note:    Date:  03/27/2018   ID:  [name], DOB 1961/04/21, MRN [id number]  PCP:  Mellody Dance, DO  Cardiologist:  Jenean Lindau, MD   Referring MD: Mellody Dance, DO    ASSESSMENT:    1. Chest discomfort    PLAN:    In order of problems listed above:  1. I reassured the patient about my findings.  Primary prevention stressed with the patient.  Importance of this with diet and medication stressed and she vocalized understanding.  She is very good with her medications that she takes for ulcerative colitis. 2. She does have an active lifestyle but does not exercise on a regular basis.  She has no known risk factors for coronary artery disease and her HDL is 78 ! 3. She was given prednisone by the emergency room doctors I told her not to take it at this time.  Her EKG and her history does not suggest pericarditis.  She knows to go to the nearest emergency room for any concerning symptoms.  To reassure her I will get a exercise stress echo in the near check.  She will be seen in follow-up appointment on a as needed basis.   Medication Adjustments/Labs and Tests Ordered: Current medicines are reviewed at length with the patient today.  Concerns regarding medicines are outlined above.  No orders of the defined types were placed in this encounter.  No orders of the defined types were placed in this encounter.    History of Present Illness:    [name] is a 57 y.o. female who is being seen today for the evaluation of chest discomfort at the request of Mellody Dance, DO.  Patient is a pleasant 57 year old female.  She has past medical history of ulcerative colitis.  Patient mentions to me that in the past 2 to 3 days she had some sensation of heaviness in the chest.  This came on and off.  She saw her primary care physician yesterday.  She was given sublingual nitroglycerin for this and this did not help her any at all.  She had PVCs on her  EKG and was sent to the emergency room.  In the emergency room she was evaluated and treated and released.  The EKG and blood work was unremarkable.  Patient denies any chest pain in relation to breathing.  There is no knifelike or stabbing sensation like pain.  It is vague in nature with no radiation.  He has had it for a few days on and off.  Exercise does not bring around the symptoms.  I asked her specifically if sexual activity made it worse and she denied it.  She told me that occasionally on standing up she felt better.  At the time of my evaluation, the patient is alert awake oriented and in no distress.  She tells me that she feels much better today.  History reviewed. No pertinent past medical history.  Past Surgical History:  Procedure Laterality Date  . APPENDECTOMY    . CHOLECYSTECTOMY    . ENDOMETRIAL ABLATION    . LAPAROTOMY    . laproscopic kne    . TONSILLECTOMY      Current Medications: Current Meds  Medication Sig  . mesalamine (LIALDA) 1.2 g EC tablet Take 2 tablets by mouth daily with breakfast.  . naproxen (NAPROSYN) 500 MG tablet Take 1 tablet (500 mg total) by mouth 2 (two) times daily.  Marland Kitchen omeprazole (PRILOSEC) 20 MG  capsule Take 1 capsule (20 mg total) by mouth daily.  . predniSONE (DELTASONE) 10 MG tablet Take 5 tablets (50 mg total) by mouth daily.  . Probiotic Product (PROBIOTIC DAILY PO) Take 1 tablet by mouth daily.  Marland Kitchen terconazole (TERAZOL 3) 0.8 % vaginal cream Place 1 applicator vaginally as needed.  . traZODone (DESYREL) 50 MG tablet Take 50 mg by mouth at bedtime as needed for sleep.     Allergies:   Patient has no known allergies.   Social History   Socioeconomic History  . Marital status: Married    Spouse name: Not on file  . Number of children: Not on file  . Years of education: Not on file  . Highest education level: Not on file  Occupational History  . Not on file  Social Needs  . Financial resource strain: Not on file  . Food  insecurity:    Worry: Not on file    Inability: Not on file  . Transportation needs:    Medical: Not on file    Non-medical: Not on file  Tobacco Use  . Smoking status: Never Smoker  . Smokeless tobacco: Never Used  Substance and Sexual Activity  . Alcohol use: Yes  . Drug use: No  . Sexual activity: Yes    Birth control/protection: Other-see comments    Comment: endometrial ablation  Lifestyle  . Physical activity:    Days per week: Not on file    Minutes per session: Not on file  . Stress: Not on file  Relationships  . Social connections:    Talks on phone: Not on file    Gets together: Not on file    Attends religious service: Not on file    Active member of club or organization: Not on file    Attends meetings of clubs or organizations: Not on file    Relationship status: Not on file  Other Topics Concern  . Not on file  Social History Narrative  . Not on file     Family History: The patient's family history includes Diabetes in her brother; Goiter in her mother; Heart attack in her mother; Heart disease in her father; Heart failure in her father; Irregular heart beat in her father and mother; Thyroid disease in her father.  ROS:   Please see the history of present illness.    All other systems reviewed and are negative.  EKGs/Labs/Other Studies Reviewed:    The following studies were reviewed today: I reviewed EKG couple of them actually from the past few days and they were unremarkable.  One EKG from the doctor's office had a couple of PVCs.   Recent Labs: 03/26/2018: B Natriuretic Peptide 74.7; BUN 12; Creatinine, Ser 0.77; Hemoglobin 11.9; Platelets 224; Potassium 3.9; Sodium 139  Recent Lipid Panel No results found for: CHOL, TRIG, HDL, CHOLHDL, VLDL, LDLCALC, LDLDIRECT  Physical Exam:    VS:  BP 118/72 (BP Location: Right Arm, Patient Position: Sitting, Cuff Size: Normal)   Pulse 79   Ht 5\' 7"  (1.702 m)   Wt 183 lb (83 kg)   SpO2 98%   BMI 28.66  kg/m     Wt Readings from Last 3 Encounters:  03/27/18 183 lb (83 kg)  03/26/18 185 lb (83.9 kg)  09/21/15 174 lb 9.6 oz (79.2 kg)     GEN: Patient is in no acute distress HEENT: Normal NECK: No JVD; No carotid bruits LYMPHATICS: No lymphadenopathy CARDIAC: S1 S2 regular, 2/6 systolic murmur at the  apex. RESPIRATORY:  Clear to auscultation without rales, wheezing or rhonchi  ABDOMEN: Soft, non-tender, non-distended MUSCULOSKELETAL:  No edema; No deformity  SKIN: Warm and dry NEUROLOGIC:  Alert and oriented x 3 PSYCHIATRIC:  Normal affect    Signed, Jenean Lindau, MD  03/27/2018 3:16 PM     [facility]

## 2018-03-27 NOTE — Patient Instructions (Signed)
Medication Instructions:  Your physician recommends that you continue on your current medications as directed. Please refer to the Current Medication list given to you today.  If you need a refill on your cardiac medications before your next appointment, please call your pharmacy.   Lab work: None If you have labs (blood work) drawn today and your tests are completely normal, you will receive your results only by: Marland Kitchen MyChart Message (if you have MyChart) OR . A paper copy in the mail If you have any lab test that is abnormal or we need to change your treatment, we will call you to review the results.  Testing/Procedures: Your physician has requested that you have a stress echocardiogram. For further information please visit [email or web address]. Please follow instruction sheet as given.   Follow-Up: At [facility], you and your health needs are our priority.  As part of our continuing mission to provide you with exceptional heart care, we have created designated Provider Care Teams.  These Care Teams include your primary Cardiologist (physician) and Advanced Practice Providers (APPs -  Physician Assistants and Nurse Practitioners) who all work together to provide you with the care you need, when you need it. You will need a follow up appointment in 0 months.  Please call our office 2 months in advance to schedule this appointment.  You may see No primary care provider on file. or another member of our [employer] in [place]: Jenne Campus, MD . Shirlee More, MD  Any Other Special Instructions Will Be Listed Below (If Applicable).

## 2018-03-28 ENCOUNTER — Encounter

## 2018-03-28 ENCOUNTER — Ambulatory Visit: Payer: 59 | Attending: Family Medicine | Admitting: Physical Therapy

## 2018-03-28 DIAGNOSIS — M25521 Pain in right elbow: Secondary | ICD-10-CM | POA: Insufficient documentation

## 2018-03-28 NOTE — Therapy (Signed)
[place], [place], [address] Phone: [phone]   Fax:  [phone]  Physical Therapy Treatment  Patient Details  Name: [name] MRN: [id number] Date of Birth: [birth year] Referring Provider (PT): Melrose Nakayama, Md   Encounter Date: 03/28/2018  PT End of Session - 03/28/18 0935    Visit Number  4    Number of Visits  13    Date for PT Re-Evaluation  04/25/18    Authorization Type  [facility]    PT Start Time  0935    PT Stop Time  1026    PT Time Calculation (min)  51 min    Activity Tolerance  Patient tolerated treatment well       No past medical history on file.  Past Surgical History:  Procedure Laterality Date  . APPENDECTOMY    . CHOLECYSTECTOMY    . ENDOMETRIAL ABLATION    . LAPAROTOMY    . laproscopic kne    . TONSILLECTOMY      There were no vitals filed for this visit.  Subjective Assessment - 03/28/18 0937    Subjective  Pt reports she had some chest pain this past week and has been cleared medically now.  Her arm is doing better.  Played the piano over the weekend and used her stretches to help.  Did have some discomfort earlier in the week after chopping alot of food.     Patient Stated Goals  tennis, piano    Currently in Pain?  No/denies         [facility] PT Assessment - 03/28/18 0001      Assessment   Medical Diagnosis  Rt lateral epicondylitis                   [facility] Adult PT Treatment/Exercise - 03/28/18 0001      Elbow Exercises   Elbow Extension  Strengthening;Right;Seated;Bar weights/barbell;20 reps    Bar Weights/Barbell (Elbow Extension)  3 lbs    Other elbow exercises  2x10., blue band, chest fly and horizontal abduction Rt UE , shoulder extension    Other elbow exercises  yellow rocking supination/pronation, then stretches      Modalities   Modalities  Electrical Stimulation;Moist Heat      Moist Heat Therapy   Number Minutes Moist Heat  15  Minutes    Moist Heat Location  --   Rt forearm     Electrical Stimulation   Electrical Stimulation Location  Rt forearm    Electrical Stimulation Action  IFC    Electrical Stimulation Parameters  to tolerance    Electrical Stimulation Goals  Pain;Tone      Manual Therapy   Manual Therapy  Soft tissue mobilization    Soft tissue mobilization  IASTM to Rt forearm       Trigger Point Dry Needling - 03/28/18 1015    Consent Given?  Yes    Education Handout Provided  No    Muscles Treated Upper Body  --   Rt forearm with good twitch response               PT Long Term Goals - 03/17/18 1017      PT LONG TERM GOAL #1   Title  Pt will be able to begin return to tennis with light volley    Status  On-going      PT LONG TERM GOAL #2   Title  Pt will be able  to play piano without limitation by elbow pain    Status  On-going      PT LONG TERM GOAL #3   Title  Pt will be able to perform all self care activities without pain    Status  On-going      PT LONG TERM GOAL #4   Title  pt will perform strong finger extension without discomfort    Status  On-going            Plan - 03/28/18 1013    Clinical Impression Statement  Ericha had a big decreased in pain since her last visit.  There is only one palpable band of tightness in the Rt extensor carpi radialis that is tender.  She tolerated initial strengthening through the arm today in preparation for taking force through the arm for tennis.     Rehab Potential  Good    PT Frequency  2x / week    PT Duration  6 weeks    PT Treatment/Interventions  ADLs/Self Care Home Management;Cryotherapy;Electrical Stimulation;Ultrasound;Moist Heat;Iontophoresis 4mg /ml Dexamethasone;Therapeutic activities;Therapeutic exercise;Manual techniques;Patient/family education;Passive range of motion;Dry needling;Taping    PT Next Visit Plan  progress HEP     Consulted and Agree with Plan of Care  Patient       Patient will benefit from  skilled therapeutic intervention in order to improve the following deficits and impairments:  Impaired UE functional use, Increased muscle spasms, Decreased activity tolerance, Pain, Decreased strength  Visit Diagnosis: Pain in right elbow     Problem List Patient Active Problem List   Diagnosis Date Noted  . Chest discomfort 03/27/2018  . Laryngopharyngeal reflux (LPR) 08/20/2017  . Ulcerative colitis ([place]) 09/21/2015  . Overweight (BMI 25.0-29.9) 09/21/2015  . Cellulitis and abscess 09/21/2015  . Lymphocytic colitis 10/17/2011  . Depression, postpartum 10/17/2011    Jeral Pinch PT  03/28/2018, 10:16 AM  [facility] [address] [place], [place], [address] Phone: [phone]   Fax:  [phone]  Name: [name] MRN: [id number] Date of Birth: Oct 29, 1960

## 2018-03-31 ENCOUNTER — Encounter: Payer: Self-pay | Admitting: Physical Therapy

## 2018-03-31 ENCOUNTER — Ambulatory Visit: Payer: 59 | Admitting: Physical Therapy

## 2018-03-31 DIAGNOSIS — M25521 Pain in right elbow: Secondary | ICD-10-CM | POA: Diagnosis not present

## 2018-03-31 NOTE — Therapy (Signed)
[place], [place], [address] Phone: [phone]   Fax:  [phone]  Physical Therapy Treatment  Patient Details  Name: [name] MRN: [id number] Date of Birth: Nov 13, 1960 Referring Provider (PT): Melrose Nakayama, Md   Encounter Date: 03/31/2018  PT End of Session - 03/31/18 1020    Visit Number  5    Number of Visits  13    Date for PT Re-Evaluation  04/25/18    Authorization Type  [facility]    PT Start Time  1020    PT Stop Time  1116    PT Time Calculation (min)  56 min    Activity Tolerance  Patient tolerated treatment well    Behavior During Therapy  [facility] for tasks assessed/performed       History reviewed. No pertinent past medical history.  Past Surgical History:  Procedure Laterality Date  . APPENDECTOMY    . CHOLECYSTECTOMY    . ENDOMETRIAL ABLATION    . LAPAROTOMY    . laproscopic kne    . TONSILLECTOMY      There were no vitals filed for this visit.  Subjective Assessment - 03/31/18 1020    Subjective  [name] reports she had a great weekend, able to carry her granddaughter , the left arm feels like the right    Patient Stated Goals  tennis, piano    Currently in Pain?  No/denies                       [facility] Adult PT Treatment/Exercise - 03/31/18 0001      Exercises   Exercises  Elbow      Elbow Exercises   Other elbow exercises  UBE L1x4' alt FWD/BWD, rebounder throwing red ball, ball bouncing on wall with arm abducted to 90 degrees,     Other elbow exercises  2x10, red band standing drawing the sword and ER with shoulder abduction. VC for form      Modalities   Modalities  Electrical Stimulation;Iontophoresis;Ultrasound      Moist Heat Therapy   Number Minutes Moist Heat  15 Minutes    Moist Heat Location  --   Rt forearm     Electrical Stimulation   Electrical Stimulation Location  Rt forearm    Electrical Stimulation Action  IFC    Electrical  Stimulation Parameters  to tolerance    Electrical Stimulation Goals  Pain;Tone      Ultrasound   Ultrasound Location  Rt forearm extensor mass    Ultrasound Parameters  100%, 1.50mHz, 1.3w/cm2    Ultrasound Goals  Pain;Other (Comment)   tone     Manual Therapy   Soft tissue mobilization  IASTM to Rt forearm                  PT Long Term Goals - 03/17/18 1017      PT LONG TERM GOAL #1   Title  Pt will be able to begin return to tennis with light volley    Status  On-going      PT LONG TERM GOAL #2   Title  Pt will be able to play piano without limitation by elbow pain    Status  On-going      PT LONG TERM GOAL #3   Title  Pt will be able to perform all self care activities without pain    Status  On-going      PT  LONG TERM GOAL #4   Title  pt will perform strong finger extension without discomfort    Status  On-going            Plan - 03/31/18 1150    Clinical Impression Statement  [name] continues to improve with pain reduction and increased function.  She was able to pick up and carry her granddaughter around this past weekend.  She has not returned to all her activity and did fatigue and have some forearm pain with resisted/band exercise today.  She would benefit from continued therapy to strength the upper body and continue to work our restrictions in the Rt forearm.  The tightness there is moving medially    Rehab Potential  Good    PT Frequency  2x / week    PT Duration  6 weeks    PT Treatment/Interventions  ADLs/Self Care Home Management;Cryotherapy;Electrical Stimulation;Ultrasound;Moist Heat;Iontophoresis 4mg /ml Dexamethasone;Therapeutic activities;Therapeutic exercise;Manual techniques;Patient/family education;Passive range of motion;Dry needling;Taping    PT Next Visit Plan  assess tolerance to new HEP    Consulted and Agree with Plan of Care  Patient       Patient will benefit from skilled therapeutic intervention in order to improve the  following deficits and impairments:  Impaired UE functional use, Increased muscle spasms, Decreased activity tolerance, Pain, Decreased strength  Visit Diagnosis: Pain in right elbow     Problem List Patient Active Problem List   Diagnosis Date Noted  . Chest discomfort 03/27/2018  . Laryngopharyngeal reflux (LPR) 08/20/2017  . Ulcerative colitis ([place]) 09/21/2015  . Overweight (BMI 25.0-29.9) 09/21/2015  . Cellulitis and abscess 09/21/2015  . Lymphocytic colitis 10/17/2011  . Depression, postpartum 10/17/2011    Jeral Pinch PT  03/31/2018, 11:54 AM  [facility] [address] [place], [place], [address] Phone: [phone]   Fax:  [phone]  Name: [name] MRN: [id number] Date of Birth: 10-21-1960

## 2018-04-03 ENCOUNTER — Encounter: Payer: Self-pay | Admitting: Physical Therapy

## 2018-04-03 ENCOUNTER — Ambulatory Visit (HOSPITAL_BASED_OUTPATIENT_CLINIC_OR_DEPARTMENT_OTHER)
Admission: RE | Admit: 2018-04-03 | Discharge: 2018-04-03 | Disposition: A | Discharge status: Home / Self Care | Payer: 59 | Source: Ambulatory Visit | Attending: Cardiology | Admitting: Cardiology

## 2018-04-03 ENCOUNTER — Ambulatory Visit: Payer: 59 | Admitting: Physical Therapy

## 2018-04-03 DIAGNOSIS — M25521 Pain in right elbow: Secondary | ICD-10-CM | POA: Diagnosis not present

## 2018-04-03 DIAGNOSIS — R0789 Other chest pain: Secondary | ICD-10-CM | POA: Insufficient documentation

## 2018-04-03 NOTE — Therapy (Signed)
[place], [place], [address] Phone: [phone]   Fax:  [phone]  Physical Therapy Treatment  Patient Details  Name: [name] MRN: [id number] Date of Birth: 1960/09/28 Referring Provider (PT): Melrose Nakayama, Md   Encounter Date: 04/03/2018  PT End of Session - 04/03/18 1338    Visit Number  6    Number of Visits  13    Date for PT Re-Evaluation  04/25/18    Authorization Type  [facility]    PT Start Time  8676    PT Stop Time  1430    PT Time Calculation (min)  56 min    Activity Tolerance  Patient tolerated treatment well    Behavior During Therapy  [facility] for tasks assessed/performed       History reviewed. No pertinent past medical history.  Past Surgical History:  Procedure Laterality Date  . APPENDECTOMY    . CHOLECYSTECTOMY    . ENDOMETRIAL ABLATION    . LAPAROTOMY    . laproscopic kne    . TONSILLECTOMY      There were no vitals filed for this visit.  Subjective Assessment - 04/03/18 1336    Subjective  Long rehearsal at chior practice yesterday, and fatigue holding book. Has not tried tennis recently.     Patient Stated Goals  tennis, piano                       [facility] Adult PT Treatment/Exercise - 04/03/18 0001      Exercises   Exercises  Other Exercises    Other Exercises   hundreds   head resting due to pain in post cervical region     Pilates   Pilates Tower  used tower to break down motions of tennis swing with light resistance      Elbow Exercises   Other elbow exercises  UBE retro L1 4 min      Electrical Stimulation   Electrical Stimulation Location  Rt epicondyle    Electrical Stimulation Action  IFC    Electrical Stimulation Parameters  to tolerance with heat, 15 min    Electrical Stimulation Goals  Pain      Manual Therapy   Manual therapy comments  skilled palpation and monitoring during TPDN    Soft tissue mobilization  Rt wrist  extensor group             PT Education - 04/03/18 1551    Education Details  positioning of arm holding music book, breakdown of tennis motion    Person(s) Educated  Patient    Methods  Explanation;Demonstration;Tactile cues;Verbal cues;Handout    Comprehension  Verbalized understanding;Returned demonstration;Verbal cues required;Tactile cues required;Need further instruction          PT Long Term Goals - 03/17/18 1017      PT LONG TERM GOAL #1   Title  Pt will be able to begin return to tennis with light volley    Status  On-going      PT LONG TERM GOAL #2   Title  Pt will be able to play piano without limitation by elbow pain    Status  On-going      PT LONG TERM GOAL #3   Title  Pt will be able to perform all self care activities without pain    Status  On-going      PT LONG TERM GOAL #4   Title  pt  will perform strong finger extension without discomfort    Status  On-going            Plan - 04/03/18 1548    Clinical Impression Statement  concordant pain reduced with TPDN. Utilized resistance on pilates tower to break down motions of tennis swing and added core exercises to improve activation along biomechanical chain.     PT Treatment/Interventions  ADLs/Self Care Home Management;Cryotherapy;Electrical Stimulation;Ultrasound;Moist Heat;Iontophoresis 4mg /ml Dexamethasone;Therapeutic activities;Therapeutic exercise;Manual techniques;Patient/family education;Passive range of motion;Dry needling;Taping    PT Next Visit Plan  continue resistance exercises, DN PRN    PT Home Exercise Plan  eccentric flexion, flexion stretch, STM roller, hundreds, UE resisted add, resisted pull to ER, resisted press fwd/down/across    Consulted and Agree with Plan of Care  Patient       Patient will benefit from skilled therapeutic intervention in order to improve the following deficits and impairments:  Impaired UE functional use, Increased muscle spasms, Decreased activity  tolerance, Pain, Decreased strength  Visit Diagnosis: Pain in right elbow     Problem List Patient Active Problem List   Diagnosis Date Noted  . Chest discomfort 03/27/2018  . Laryngopharyngeal reflux (LPR) 08/20/2017  . Ulcerative colitis ([place]) 09/21/2015  . Overweight (BMI 25.0-29.9) 09/21/2015  . Cellulitis and abscess 09/21/2015  . Lymphocytic colitis 10/17/2011  . Depression, postpartum 10/17/2011    Gay Filler. Shelbie Franken PT, DPT 04/03/18 3:53 PM   [facility] [address] [place], [place], [address] Phone: [phone]   Fax:  [phone]  Name: [name] MRN: [id number] Date of Birth: 03-03-1961

## 2018-04-03 NOTE — Progress Notes (Signed)
  Echocardiogram Echocardiogram Stress Test has been performed.  [name] [name] 04/03/2018, 9:44 AM

## 2018-04-08 ENCOUNTER — Ambulatory Visit: Payer: 59 | Admitting: Physical Therapy

## 2018-04-11 ENCOUNTER — Ambulatory Visit: Payer: 59 | Admitting: Physical Therapy

## 2018-04-11 ENCOUNTER — Encounter: Payer: Self-pay | Admitting: Physical Therapy

## 2018-04-11 DIAGNOSIS — M25521 Pain in right elbow: Secondary | ICD-10-CM | POA: Diagnosis not present

## 2018-04-11 NOTE — Therapy (Signed)
[place], [place], [address] Phone: [phone]   Fax:  [phone]  Physical Therapy Treatment  Patient Details  Name: [name] MRN: [id number] Date of Birth: 03/10/1961 Referring Provider (PT): Melrose Nakayama, Md   Encounter Date: 04/11/2018  PT End of Session - 04/11/18 1021    Visit Number  7    Number of Visits  13    Date for PT Re-Evaluation  04/25/18    Authorization Type  [facility] [employer]    PT Start Time  1016    PT Stop Time  1115    PT Time Calculation (min)  59 min    Activity Tolerance  Patient tolerated treatment well    Behavior During Therapy  [facility] for tasks assessed/performed       History reviewed. No pertinent past medical history.  Past Surgical History:  Procedure Laterality Date  . APPENDECTOMY    . CHOLECYSTECTOMY    . ENDOMETRIAL ABLATION    . LAPAROTOMY    . laproscopic kne    . TONSILLECTOMY      There were no vitals filed for this visit.  Subjective Assessment - 04/11/18 1021    Subjective  some soreness in musle bellies. stretches are helpful. brushing teeth and blow drying hair was not painful. playing piano has been okay. held book at choir practice.     Patient Stated Goals  tennis, piano                       [facility] Adult PT Treatment/Exercise - 04/11/18 0001      Elbow Exercises   Elbow Extension Limitations  GHJ ext palm fwd    Forearm Pronation Limitations  flexion & abd to 90 in pronation 2lb    Other elbow exercises  flx to 90 supination-pronation 1lb    Other elbow exercises  flexion neutral 1lb, flexion in supination OH 2lb      Moist Heat Therapy   Number Minutes Moist Heat  15 Minutes   with ESTIM   Moist Heat Location  Elbow      Electrical Stimulation   Electrical Stimulation Location  Rt epicondyle    Electrical Stimulation Action  IFC    Electrical Stimulation Parameters  to tolerance with heat    Electrical Stimulation  Goals  Pain      Manual Therapy   Manual therapy comments  skilled palpation and monitoring during TPDN    Soft tissue mobilization  IASTM wrist extensor group       Trigger Point Dry Needling - 04/11/18 1045    Muscles Treated Upper Body  --   Rt wrist extensor group          PT Education - 04/11/18 1114    Education Details  discussion of positions and progression of pain, anatomy of condition with partial tear    Person(s) Educated  Patient    Methods  Explanation    Comprehension  Verbalized understanding;Need further instruction          PT Long Term Goals - 03/17/18 1017      PT LONG TERM GOAL #1   Title  Pt will be able to begin return to tennis with light volley    Status  On-going      PT LONG TERM GOAL #2   Title  Pt will be able to play piano without limitation by elbow pain    Status  On-going  PT LONG TERM GOAL #3   Title  Pt will be able to perform all self care activities without pain    Status  On-going      PT LONG TERM GOAL #4   Title  pt will perform strong finger extension without discomfort    Status  On-going            Plan - 04/11/18 1111    Clinical Impression Statement  Soreness noted in wrist extensors but good tolerance to motions. small trigger point addressed with DN after IASTM.     PT Treatment/Interventions  ADLs/Self Care Home Management;Cryotherapy;Electrical Stimulation;Ultrasound;Moist Heat;Iontophoresis 4mg /ml Dexamethasone;Therapeutic activities;Therapeutic exercise;Manual techniques;Patient/family education;Passive range of motion;Dry needling;Taping    PT Next Visit Plan  continue resistance exercises, DN PRN- try to ween off    PT Home Exercise Plan  eccentric flexion, flexion stretch, STM roller, hundreds, UE resisted add, resisted pull to ER, resisted press fwd/down/across    Consulted and Agree with Plan of Care  Patient       Patient will benefit from skilled therapeutic intervention in order to improve  the following deficits and impairments:  Impaired UE functional use, Increased muscle spasms, Decreased activity tolerance, Pain, Decreased strength  Visit Diagnosis: Pain in right elbow     Problem List Patient Active Problem List   Diagnosis Date Noted  . Chest discomfort 03/27/2018  . Laryngopharyngeal reflux (LPR) 08/20/2017  . Ulcerative colitis ([place]) 09/21/2015  . Overweight (BMI 25.0-29.9) 09/21/2015  . Cellulitis and abscess 09/21/2015  . Lymphocytic colitis 10/17/2011  . Depression, postpartum 10/17/2011   Gay Filler. Latoya Maulding PT, DPT 04/11/18 11:14 AM   [facility] [address] [place], [place], [address] Phone: [phone]   Fax:  [phone]  Name: [name] MRN: [id number] Date of Birth: Sep 06, 1960

## 2018-04-14 ENCOUNTER — Encounter: Payer: Self-pay | Admitting: Physical Therapy

## 2018-04-14 ENCOUNTER — Ambulatory Visit: Payer: 59 | Admitting: Physical Therapy

## 2018-04-14 DIAGNOSIS — M25521 Pain in right elbow: Secondary | ICD-10-CM | POA: Diagnosis not present

## 2018-04-14 NOTE — Therapy (Signed)
[place], [place], [address] Phone: [phone]   Fax:  [phone]  Physical Therapy Treatment  Patient Details  Name: [name] MRN: [id number] Date of Birth: January 29, 1961 Referring Provider (PT): Melrose Nakayama, Md   Encounter Date: 04/14/2018  PT End of Session - 04/14/18 1311    Visit Number  8    Number of Visits  13    Date for PT Re-Evaluation  04/25/18    Authorization Type  [facility] [employer]    PT Start Time  1027    PT Stop Time  1121    PT Time Calculation (min)  54 min    Activity Tolerance  Patient tolerated treatment well    Behavior During Therapy  [facility] for tasks assessed/performed       History reviewed. No pertinent past medical history.  Past Surgical History:  Procedure Laterality Date  . APPENDECTOMY    . CHOLECYSTECTOMY    . ENDOMETRIAL ABLATION    . LAPAROTOMY    . laproscopic kne    . TONSILLECTOMY      There were no vitals filed for this visit.  Subjective Assessment - 04/14/18 1031    Subjective  Did a lot of cooking and picking things up over the weekend.                        [facility] Adult PT Treatment/Exercise - 04/14/18 0001      Elbow Exercises   Other elbow exercises  shoulder flexion & door stretch    Other elbow exercises  warmup exercises- see pt instructions      Moist Heat Therapy   Number Minutes Moist Heat  15 Minutes   with heat   Moist Heat Location  Elbow      Cryotherapy   Number Minutes Cryotherapy  5 Minutes    Cryotherapy Location  --   elbow   Type of Cryotherapy  Ice massage      Electrical Stimulation   Electrical Stimulation Location  Rt epicondyle    Electrical Stimulation Action  IFC    Electrical Stimulation Parameters  to tol with heat    Electrical Stimulation Goals  Pain                  PT Long Term Goals - 03/17/18 1017      PT LONG TERM GOAL #1   Title  Pt will be able to begin return to  tennis with light volley    Status  On-going      PT LONG TERM GOAL #2   Title  Pt will be able to play piano without limitation by elbow pain    Status  On-going      PT LONG TERM GOAL #3   Title  Pt will be able to perform all self care activities without pain    Status  On-going      PT LONG TERM GOAL #4   Title  pt will perform strong finger extension without discomfort    Status  On-going            Plan - 04/14/18 1312    Clinical Impression Statement  Sore after doing a lot of activity over the weekend but did not have increased pain at attachment site. Ice massage was helpful. Created a warmup program for prior to tennis to decrease risk of injury    PT Treatment/Interventions  ADLs/Self Care  Home Management;Cryotherapy;Electrical Stimulation;Ultrasound;Moist Heat;Iontophoresis 4mg /ml Dexamethasone;Therapeutic activities;Therapeutic exercise;Manual techniques;Patient/family education;Passive range of motion;Dry needling;Taping    PT Next Visit Plan  continue resistance exercises, DN PRN- try to ween off    PT Home Exercise Plan  eccentric flexion, flexion stretch, STM roller, hundreds, UE resisted add, resisted pull to ER, resisted press fwd/down/across, warmup program    Consulted and Agree with Plan of Care  Patient       Patient will benefit from skilled therapeutic intervention in order to improve the following deficits and impairments:  Impaired UE functional use, Increased muscle spasms, Decreased activity tolerance, Pain, Decreased strength  Visit Diagnosis: Pain in right elbow     Problem List Patient Active Problem List   Diagnosis Date Noted  . Chest discomfort 03/27/2018  . Laryngopharyngeal reflux (LPR) 08/20/2017  . Ulcerative colitis ([place]) 09/21/2015  . Overweight (BMI 25.0-29.9) 09/21/2015  . Cellulitis and abscess 09/21/2015  . Lymphocytic colitis 10/17/2011  . Depression, postpartum 10/17/2011    Gay Filler. Glynna Failla PT, DPT 04/14/18 1:22  PM   [facility] [address] [place], [place], [address] Phone: [phone]   Fax:  [phone]  Name: [name] MRN: [id number] Date of Birth: 1960-12-21

## 2018-04-17 ENCOUNTER — Encounter: Payer: Self-pay | Admitting: Physical Therapy

## 2018-04-17 ENCOUNTER — Ambulatory Visit: Payer: 59 | Admitting: Physical Therapy

## 2018-04-17 DIAGNOSIS — M25521 Pain in right elbow: Secondary | ICD-10-CM | POA: Diagnosis not present

## 2018-04-17 NOTE — Therapy (Addendum)
[place], [place], [address] Phone: [phone]   Fax:  [phone]  Physical Therapy Treatment/Discharge  Patient Details  Name: [name] MRN: [id number] Date of Birth: 05/22/1960 Referring Provider (PT): Yvette Nakayama, Md   Encounter Date: 04/17/2018  PT End of Session - 04/17/18 8921    Visit Number  9    Number of Visits  13    Date for PT Re-Evaluation  04/25/18    Authorization Type  [facility]    PT Start Time  0935    PT Stop Time  1015    PT Time Calculation (min)  40 min    Activity Tolerance  Patient tolerated treatment well    Behavior During Therapy  [facility] for tasks assessed/performed       History reviewed. No pertinent past medical history.  Past Surgical History:  Procedure Laterality Date  . APPENDECTOMY    . CHOLECYSTECTOMY    . ENDOMETRIAL ABLATION    . LAPAROTOMY    . laproscopic kne    . TONSILLECTOMY      There were no vitals filed for this visit.  Subjective Assessment - 04/17/18 0937    Subjective  not nearly as sore today, I have been trying to move through a tennis swing.     Currently in Pain?  No/denies         [facility] PT Assessment - 04/17/18 0001      Strength   Overall Strength Comments  could feel it with 3-5 ext but no pain                   [facility] Adult PT Treatment/Exercise - 04/17/18 0001      Exercises   Exercises  Shoulder      Elbow Exercises   Elbow Flexion Limitations  5 lb dead lift single arm    Other elbow exercises  pec door stretch    Other elbow exercises  UBE retro 4 min      Shoulder Exercises: Standing   Horizontal ABduction  15 reps    Horizontal ABduction Weight (lbs)  1    Horizontal ABduction Limitations  bent with Lt hand on table    Flexion Limitations  body blade OH long arc    Other Standing Exercises  OH press 5lb      Shoulder Exercises: Body Blade   External Rotation  3 reps      Cryotherapy    Number Minutes Cryotherapy  5 Minutes    Cryotherapy Location  --   Rt lateral epicondyle   Type of Cryotherapy  Ice massage                  PT Long Term Goals - 03/17/18 1017      PT LONG TERM GOAL #1   Title  Pt will be able to begin return to tennis with light volley    Status  On-going      PT LONG TERM GOAL #2   Title  Pt will be able to play piano without limitation by elbow pain    Status  On-going      PT LONG TERM GOAL #3   Title  Pt will be able to perform all self care activities without pain    Status  On-going      PT LONG TERM GOAL #4   Title  pt will perform strong finger extension without discomfort  Status  On-going            Plan - 04/17/18 1015    Clinical Impression Statement  increased weight and control challenges today with good form. Asked her to be cognisant when moving bags while traveling. will continue to challenge next week and d/c if appropriate    PT Treatment/Interventions  ADLs/Self Care Home Management;Cryotherapy;Electrical Stimulation;Ultrasound;Moist Heat;Iontophoresis 80m/ml Dexamethasone;Therapeutic activities;Therapeutic exercise;Manual techniques;Patient/family education;Passive range of motion;Dry needling;Taping    PT Next Visit Plan  continue resistance exercises, DN PRN- try to ween off    PT Home Exercise Plan  eccentric flexion, flexion stretch, STM roller, hundreds, UE resisted add, resisted pull to ER, resisted press fwd/down/across, warmup program    Consulted and Agree with Plan of Care  Patient       Patient will benefit from skilled therapeutic intervention in order to improve the following deficits and impairments:  Impaired UE functional use, Increased muscle spasms, Decreased activity tolerance, Pain, Decreased strength  Visit Diagnosis: Pain in right elbow     Problem List Patient Active Problem List   Diagnosis Date Noted  . Chest discomfort 03/27/2018  . Laryngopharyngeal reflux (LPR)  08/20/2017  . Ulcerative colitis ([place] 09/21/2015  . Overweight (BMI 25.0-29.9) 09/21/2015  . Cellulitis and abscess 09/21/2015  . Lymphocytic colitis 10/17/2011  . Depression, postpartum 10/17/2011    JGay Filler Tyna Huertas PT, DPT 04/17/18 10:17 AM   [facility] [address]Phone: [phone]  Fax:  [phone] Name: Yvette KATTNERMRN: [id number] of Birth: [birth year] PHYSICAL THERAPY DISCHARGE SUMMARY  Visits from Start of Care: 9  Current functional level related to goals / functional outcomes: See above   Remaining deficits: See above   Education / Equipment: Anatomy of condition, POC, HEP, exercise form/rationale  Plan: Patient agrees to discharge.  Patient goals were partially met. Patient is being discharged due to being pleased with the current functional level.  ?????     [name] PT, DPT 06/06/18 8:13 AM

## 2018-05-02 DIAGNOSIS — Z01419 Encounter for gynecological examination (general) (routine) without abnormal findings: Secondary | ICD-10-CM | POA: Diagnosis not present

## 2018-05-02 DIAGNOSIS — Z6829 Body mass index (BMI) 29.0-29.9, adult: Secondary | ICD-10-CM | POA: Diagnosis not present

## 2018-05-02 DIAGNOSIS — Z1231 Encounter for screening mammogram for malignant neoplasm of breast: Secondary | ICD-10-CM | POA: Diagnosis not present

## 2018-05-09 MED FILL — traZODone HCL 50 MG TABS: 50 | 90 days supply | Qty: 270 | Fill #1

## 2018-05-09 MED FILL — LIALDA 1.2 GM TABLET SA: 1.2 | 30 days supply | Qty: 120 | Fill #2

## 2018-05-20 DIAGNOSIS — K219 Gastro-esophageal reflux disease without esophagitis: Secondary | ICD-10-CM | POA: Diagnosis not present

## 2018-05-20 DIAGNOSIS — K51911 Ulcerative colitis, unspecified with rectal bleeding: Secondary | ICD-10-CM | POA: Diagnosis not present

## 2018-05-20 DIAGNOSIS — R0789 Other chest pain: Secondary | ICD-10-CM | POA: Diagnosis not present

## 2018-05-20 DIAGNOSIS — R635 Abnormal weight gain: Secondary | ICD-10-CM | POA: Diagnosis not present

## 2018-05-20 MED FILL — OMEPRAZOLE 40 MG CPDR: 40 | 90 days supply | Qty: 90 | Fill #0

## 2018-05-22 ENCOUNTER — Telehealth: Payer: Self-pay | Admitting: *Deleted

## 2018-05-22 NOTE — Telephone Encounter (Signed)
Patient is a patient of Dr. Julien Nordmann she was advised to follow up on a as needed basis. She is still having some shortness of breath and would like to see Dr. Geraldo Pitter for this. I also requested records from gastroenterologist Juanita Craver be faxed to [facility]. Patient scheduled to see Dr. Geraldo Pitter next week.

## 2018-05-22 NOTE — Telephone Encounter (Signed)
Went to gastroenterologist and pt mentioned shortness of breath and Dr. Was concerned about shortness of breath and felt pt should reconnect with Dr. Geraldo Pitter. Please advise.

## 2018-05-27 ENCOUNTER — Ambulatory Visit: Payer: Self-pay | Admitting: Cardiology

## 2018-05-27 ENCOUNTER — Encounter: Payer: Self-pay | Admitting: Cardiology

## 2018-05-27 ENCOUNTER — Ambulatory Visit: Payer: 59 | Admitting: Cardiology

## 2018-05-27 VITALS — BP 118/60 | HR 80 | Ht 67.0 in | Wt 187.0 lb

## 2018-05-27 DIAGNOSIS — I209 Angina pectoris, unspecified: Secondary | ICD-10-CM

## 2018-05-27 DIAGNOSIS — Z01812 Encounter for preprocedural laboratory examination: Secondary | ICD-10-CM | POA: Diagnosis not present

## 2018-05-27 DIAGNOSIS — Q231 Congenital insufficiency of aortic valve: Secondary | ICD-10-CM | POA: Diagnosis not present

## 2018-05-27 MED ORDER — METOPROLOL TARTRATE 50 MG PO TABS: 100.0000 mg | ORAL_TABLET | Freq: Once | ORAL | 0 refills | Status: DC

## 2018-05-27 NOTE — Progress Notes (Signed)
Cardiology Office Note:    Date:  05/27/2018   ID:  [name], DOB 1961/01/27, MRN [id number]  PCP:  Mellody Dance, DO  Cardiologist:  Jenean Lindau, MD   Referring MD: Mellody Dance, DO    ASSESSMENT:    1. Angina pectoris ([place])   2. Bicuspid aortic valve    PLAN:    In order of problems listed above:  1. Primary prevention stressed to the patient.  Importance of compliance with diet and medication stressed and she vocalized understanding. 2. Stress echocardiogram report was reviewed.  I reassured her about my findings. 3. In view of the fact that she has shortness of breath on exertion and is concerned about it I will set her up for CT coronary angiography.  This will also help assess bicuspid aortic valve possibly but I think it will give [place] more information about the aortic anatomy also.  Her lipids are followed by her primary care physician. 4. Patient will be seen in follow-up appointment in 6 months or earlier if the patient has any concerns.  She knows to go to the nearest emergency room for any significant concerns.   Medication Adjustments/Labs and Tests Ordered: Current medicines are reviewed at length with the patient today.  Concerns regarding medicines are outlined above.  No orders of the defined types were placed in this encounter.  No orders of the defined types were placed in this encounter.    No chief complaint on file.    History of Present Illness:    [name] is a 58 y.o. female.  Patient was evaluated by me for chest pain.  Her echocardiogram that was reviewed on the stress test revealed bicuspid aortic valve.  Subsequently she mentions to me that she has had a GI evaluation.  She gives history of shortness of breath on exertion and some chest tightness and is concerned about it.  Overall the symptoms may or may not come with exertion's.  She is a very active lady and regular activity does not bring around chest pain.  Her  symptoms are mixed there is no radiation to the neck or to the arms.  At the time of my evaluation, the patient is alert awake oriented and in no distress.  No past medical history on file.  Past Surgical History:  Procedure Laterality Date  . APPENDECTOMY    . CHOLECYSTECTOMY    . ENDOMETRIAL ABLATION    . LAPAROTOMY    . laproscopic kne    . TONSILLECTOMY      Current Medications: Current Meds  Medication Sig  . mesalamine (LIALDA) 1.2 g EC tablet Take 2 tablets by mouth daily with breakfast.  . omeprazole (PRILOSEC) 20 MG capsule Take 1 capsule (20 mg total) by mouth daily.  . Probiotic Product (PROBIOTIC DAILY PO) Take 1 tablet by mouth daily.  Marland Kitchen terconazole (TERAZOL 3) 0.8 % vaginal cream Place 1 applicator vaginally as needed.  . traZODone (DESYREL) 50 MG tablet Take 50 mg by mouth at bedtime as needed for sleep.     Allergies:   Patient has no known allergies.   Social History   Socioeconomic History  . Marital status: Married    Spouse name: Not on file  . Number of children: Not on file  . Years of education: Not on file  . Highest education level: Not on file  Occupational History  . Not on file  Social Needs  . Financial resource strain: Not on file  .  Food insecurity:    Worry: Not on file    Inability: Not on file  . Transportation needs:    Medical: Not on file    Non-medical: Not on file  Tobacco Use  . Smoking status: Never Smoker  . Smokeless tobacco: Never Used  Substance and Sexual Activity  . Alcohol use: Yes  . Drug use: No  . Sexual activity: Yes    Birth control/protection: Other-see comments    Comment: endometrial ablation  Lifestyle  . Physical activity:    Days per week: Not on file    Minutes per session: Not on file  . Stress: Not on file  Relationships  . Social connections:    Talks on phone: Not on file    Gets together: Not on file    Attends religious service: Not on file    Active member of club or organization: Not on  file    Attends meetings of clubs or organizations: Not on file    Relationship status: Not on file  Other Topics Concern  . Not on file  Social History Narrative  . Not on file     Family History: The patient's family history includes Diabetes in her brother; Goiter in her mother; Heart attack in her mother; Heart disease in her father; Heart failure in her father; Irregular heart beat in her father and mother; Thyroid disease in her father.  ROS:   Please see the history of present illness.    All other systems reviewed and are negative.  EKGs/Labs/Other Studies Reviewed:    The following studies were reviewed today: I discussed my findings with the patient at extensive length.   Recent Labs: 03/26/2018: B Natriuretic Peptide 74.7; BUN 12; Creatinine, Ser 0.77; Hemoglobin 11.9; Platelets 224; Potassium 3.9; Sodium 139  Recent Lipid Panel No results found for: CHOL, TRIG, HDL, CHOLHDL, VLDL, LDLCALC, LDLDIRECT  Physical Exam:    VS:  BP 118/60 (BP Location: Right Arm, Patient Position: Sitting, Cuff Size: Normal)   Pulse 80   Ht 5\' 7"  (1.702 m)   Wt 187 lb (84.8 kg)   SpO2 98%   BMI 29.29 kg/m     Wt Readings from Last 3 Encounters:  05/27/18 187 lb (84.8 kg)  03/27/18 183 lb (83 kg)  03/26/18 185 lb (83.9 kg)     GEN: Patient is in no acute distress HEENT: Normal NECK: No JVD; No carotid bruits LYMPHATICS: No lymphadenopathy CARDIAC: Hear sounds regular, 2/6 systolic murmur at the apex. RESPIRATORY:  Clear to auscultation without rales, wheezing or rhonchi  ABDOMEN: Soft, non-tender, non-distended MUSCULOSKELETAL:  No edema; No deformity  SKIN: Warm and dry NEUROLOGIC:  Alert and oriented x 3 PSYCHIATRIC:  Normal affect   Signed, Jenean Lindau, MD  05/27/2018 10:03 AM    [place]

## 2018-05-27 NOTE — Patient Instructions (Addendum)
Medication Instructions:  Your physician recommends that you continue on your current medications as directed. Please refer to the Current Medication list given to you today.  If you need a refill on your cardiac medications before your next appointment, please call your pharmacy.   Lab work: Your physician recommends that you return for lab work within 3-7 days before your cardiac CTA: BMP. Please return to our office for lab work, no appointment needed. No need to fast beforehand.   If you have labs (blood work) drawn today and your tests are completely normal, you will receive your results only by: Marland Kitchen MyChart Message (if you have MyChart) OR . A paper copy in the mail If you have any lab test that is abnormal or we need to change your treatment, we will call you to review the results.  Testing/Procedures: Your physician has requested that you have cardiac CT. Cardiac computed tomography (CT) is a painless test that uses an x-ray machine to take clear, detailed pictures of your heart. For further information please visit [employer]uge[employer]tn. Please follow instruction sheet as given.   Please arrive at the [facility] main entrance of [facility] at xx:xx AM (30-45 minutes prior to test start time)  [facility] [place], [place] [address] [phone]  Proceed to the [facility] Radiology Department (First Floor).  Please follow these instructions carefully (unless otherwise directed):   On the Night Before the Test: . Be sure to Drink plenty of water. . Do not consume any caffeinated/decaffeinated beverages or chocolate 12 hours prior to your test. . Do not take any antihistamines 12 hours prior to your test. .  On the Day of the Test: . Drink plenty of water. Do not drink any water within one hour of the test. . Do not eat any food 4 hours prior to the test. . You may take your regular medications prior to the test.  . Take metoprolol  (Lopressor) two hours prior to test.                   -If HR is less than 55 BPM- No Beta Blocker                -IF HR is greater than 55 BPM and patient is less than or equal to 34 yrs old Lopressor 100mg  x1.                -If HR is greater than 55 BPM and patient is greater than 21 yrs old Lopressor 50 mg x1.         After the Test: . Drink plenty of water. . After receiving IV contrast, you may experience a mild flushed feeling. This is normal. . On occasion, you may experience a mild rash up to 24 hours after the test. This is not dangerous. If this occurs, you can take Benadryl 25 mg and increase your fluid intake. . If you experience trouble breathing, this can be serious. If it is severe call 911 IMMEDIATELY. If it is mild, please call our office.   Follow-Up: At [facility], you and your health needs are our priority.  As part of our continuing mission to provide you with exceptional heart care, we have created designated Provider Care Teams.  These Care Teams include your primary Cardiologist (physician) and Advanced Practice Providers (APPs -  Physician Assistants and Nurse Practitioners) who all work together to provide you with the care you need,  when you need it. You will need a follow up appointment in 6 months.  Please call our office 2 months in advance to schedule this appointment.       Cardiac CT Angiogram  A cardiac CT angiogram is a procedure to look at the heart and the area around the heart. It may be done to help find the cause of chest pains or other symptoms of heart disease. During this procedure, a large X-ray machine, called a CT scanner, takes detailed pictures of the heart and the surrounding area after a dye (contrast material) has been injected into blood vessels in the area. The procedure is also sometimes called a coronary CT angiogram, coronary artery scanning, or CTA. A cardiac CT angiogram allows the health care provider to see how well blood is  flowing to and from the heart. The health care provider will be able to see if there are any problems, such as:  Blockage or narrowing of the coronary arteries in the heart.  Fluid around the heart.  Signs of weakness or disease in the muscles, valves, and tissues of the heart. Tell a health care provider about:  Any allergies you have. This is especially important if you have had a previous allergic reaction to contrast dye.  All medicines you are taking, including vitamins, herbs, eye drops, creams, and over-the-counter medicines.  Any blood disorders you have.  Any surgeries you have had.  Any medical conditions you have.  Whether you are pregnant or may be pregnant.  Any anxiety disorders, chronic pain, or other conditions you have that may increase your stress or prevent you from lying still. What are the risks? Generally, this is a safe procedure. However, problems may occur, including:  Bleeding.  Infection.  Allergic reactions to medicines or dyes.  Damage to other structures or organs.  Kidney damage from the dye or contrast that is used.  Increased risk of cancer from radiation exposure. This risk is low. Talk with your health care provider about: ? The risks and benefits of testing. ? How you can receive the lowest dose of radiation. What happens before the procedure?  Wear comfortable clothing and remove any jewelry, glasses, dentures, and hearing aids.  Follow instructions from your health care provider about eating and drinking. This may include: ? For 12 hours before the test - avoid caffeine. This includes tea, coffee, soda, energy drinks, and diet pills. Drink plenty of water or other fluids that do not have caffeine in them. Being well-hydrated can prevent complications. ? For 4-6 hours before the test - stop eating and drinking. The contrast dye can cause nausea, but this is less likely if your stomach is empty.  Ask your health care provider about  changing or stopping your regular medicines. This is especially important if you are taking diabetes medicines, blood thinners, or medicines to treat erectile dysfunction. What happens during the procedure?  Hair on your chest may need to be removed so that small sticky patches called electrodes can be placed on your chest. These will transmit information that helps to monitor your heart during the test.  An IV tube will be inserted into one of your veins.  You might be given a medicine to control your heart rate during the test. This will help to ensure that good images are obtained.  You will be asked to lie on an exam table. This table will slide in and out of the CT machine during the procedure.  Contrast dye  will be injected into the IV tube. You might feel warm, or you may get a metallic taste in your mouth.  You will be given a medicine (nitroglycerin) to relax (dilate) the arteries in your heart.  The table that you are lying on will move into the CT machine tunnel for the scan.  The person running the machine will give you instructions while the scans are being done. You may be asked to: ? Keep your arms above your head. ? Hold your breath. ? Stay very still, even if the table is moving.  When the scanning is complete, you will be moved out of the machine.  The IV tube will be removed. The procedure may vary among health care providers and hospitals. What happens after the procedure?  You might feel warm, or you may get a metallic taste in your mouth from the contrast dye.  You may have a headache from the nitroglycerin.  After the procedure, drink water or other fluids to wash (flush) the contrast material out of your body.  Contact a health care provider if you have any symptoms of allergy to the contrast. These symptoms include: ? Shortness of breath. ? Rash or hives. ? A racing heartbeat.  Most people can return to their normal activities right after the procedure.  Ask your health care provider what activities are safe for you.  It is up to you to get the results of your procedure. Ask your health care provider, or the department that is doing the procedure, when your results will be ready. Summary  A cardiac CT angiogram is a procedure to look at the heart and the area around the heart. It may be done to help find the cause of chest pains or other symptoms of heart disease.  During this procedure, a large X-ray machine, called a CT scanner, takes detailed pictures of the heart and the surrounding area after a dye (contrast material) has been injected into blood vessels in the area.  Ask your health care provider about changing or stopping your regular medicines before the procedure. This is especially important if you are taking diabetes medicines, blood thinners, or medicines to treat erectile dysfunction.  After the procedure, drink water or other fluids to wash (flush) the contrast material out of your body. This information is not intended to replace advice given to you by your health care provider. Make sure you discuss any questions you have with your health care provider. Document Released: 03/22/2008 Document Revised: 02/27/2016 Document Reviewed: 02/27/2016 Elsevier Interactive Patient Education  2019 [employer].

## 2018-06-05 ENCOUNTER — Telehealth: Payer: Self-pay | Admitting: Cardiology

## 2018-06-05 NOTE — Telephone Encounter (Signed)
Reached out to Mack Guise, cardiac CTA scheduler, for an update on the status of scheduling of patient's cardiac CTA.

## 2018-06-05 NOTE — Telephone Encounter (Signed)
Left message informing patient that insurance has approved the cardiac CTA and the scheduler should contact her to schedule testing tomorrow. Advised her to contact our office with further questions or concerns.

## 2018-06-09 ENCOUNTER — Other Ambulatory Visit: Payer: Self-pay | Admitting: Internal Medicine

## 2018-06-09 ENCOUNTER — Ambulatory Visit
Admission: RE | Admit: 2018-06-09 | Discharge: 2018-06-09 | Disposition: A | Discharge status: Home / Self Care | Payer: 59 | Source: Ambulatory Visit | Attending: Internal Medicine | Admitting: Internal Medicine

## 2018-06-09 DIAGNOSIS — M25552 Pain in left hip: Secondary | ICD-10-CM

## 2018-06-09 DIAGNOSIS — M5432 Sciatica, left side: Secondary | ICD-10-CM

## 2018-06-09 DIAGNOSIS — E559 Vitamin D deficiency, unspecified: Secondary | ICD-10-CM | POA: Diagnosis not present

## 2018-06-09 DIAGNOSIS — R200 Anesthesia of skin: Secondary | ICD-10-CM | POA: Diagnosis not present

## 2018-06-09 DIAGNOSIS — M47816 Spondylosis without myelopathy or radiculopathy, lumbar region: Secondary | ICD-10-CM | POA: Diagnosis not present

## 2018-06-09 DIAGNOSIS — Q231 Congenital insufficiency of aortic valve: Secondary | ICD-10-CM | POA: Diagnosis not present

## 2018-06-09 DIAGNOSIS — D443 Neoplasm of uncertain behavior of pituitary gland: Secondary | ICD-10-CM | POA: Diagnosis not present

## 2018-06-09 DIAGNOSIS — R635 Abnormal weight gain: Secondary | ICD-10-CM | POA: Diagnosis not present

## 2018-06-09 MED FILL — METOPROLOL TARTRATE 50 MG T: 50 | 1 days supply | Qty: 2 | Fill #0

## 2018-06-09 MED FILL — ETODOLAC 400 MG TABLET: 400 | 7 days supply | Qty: 14 | Fill #0

## 2018-06-11 DIAGNOSIS — M545 Low back pain: Secondary | ICD-10-CM | POA: Diagnosis not present

## 2018-06-12 ENCOUNTER — Other Ambulatory Visit: Payer: Self-pay | Admitting: Endocrinology

## 2018-06-12 DIAGNOSIS — D443 Neoplasm of uncertain behavior of pituitary gland: Secondary | ICD-10-CM

## 2018-06-12 DIAGNOSIS — D444 Neoplasm of uncertain behavior of craniopharyngeal duct: Principal | ICD-10-CM

## 2018-06-18 ENCOUNTER — Telehealth (HOSPITAL_COMMUNITY): Payer: Self-pay | Admitting: Emergency Medicine

## 2018-06-18 NOTE — Telephone Encounter (Signed)
Left message on voicemail with name and callback number Haylo Fake RN Navigator Cardiac Imaging [facility] [phone] Office [phone] Cell  

## 2018-06-19 DIAGNOSIS — D443 Neoplasm of uncertain behavior of pituitary gland: Secondary | ICD-10-CM | POA: Diagnosis not present

## 2018-06-19 DIAGNOSIS — M5416 Radiculopathy, lumbar region: Secondary | ICD-10-CM | POA: Diagnosis not present

## 2018-06-19 DIAGNOSIS — Z01812 Encounter for preprocedural laboratory examination: Secondary | ICD-10-CM | POA: Diagnosis not present

## 2018-06-19 DIAGNOSIS — Q231 Congenital insufficiency of aortic valve: Secondary | ICD-10-CM | POA: Diagnosis not present

## 2018-06-19 DIAGNOSIS — R202 Paresthesia of skin: Secondary | ICD-10-CM | POA: Diagnosis not present

## 2018-06-19 DIAGNOSIS — I209 Angina pectoris, unspecified: Secondary | ICD-10-CM | POA: Diagnosis not present

## 2018-06-20 ENCOUNTER — Ambulatory Visit (HOSPITAL_COMMUNITY)
Admission: RE | Admit: 2018-06-20 | Discharge: 2018-06-20 | Disposition: A | Discharge status: Home / Self Care | Payer: 59 | Source: Ambulatory Visit | Attending: Cardiology | Admitting: Cardiology

## 2018-06-20 DIAGNOSIS — I209 Angina pectoris, unspecified: Secondary | ICD-10-CM | POA: Insufficient documentation

## 2018-06-20 LAB — BASIC METABOLIC PANEL
BUN/Creatinine Ratio: 21 (ref 9–23)
BUN: 16 mg/dL (ref 6–24)
CO2: 24 mmol/L (ref 20–29)
CREATININE: 0.75 mg/dL (ref 0.57–1.00)
Calcium: 8.9 mg/dL (ref 8.7–10.2)
Chloride: 103 mmol/L (ref 96–106)
GFR calc Af Amer: 103 mL/min/{1.73_m2} (ref >59)
GFR, EST NON AFRICAN AMERICAN: 89 mL/min/{1.73_m2} (ref >59)
GLUCOSE: 115 mg/dL — AB (ref 65–99)
Potassium: 3.9 mmol/L (ref 3.5–5.2)
Sodium: 140 mmol/L (ref 134–144)

## 2018-06-20 MED ORDER — NITROGLYCERIN 0.4 MG SL SUBL
SUBLINGUAL_TABLET | SUBLINGUAL | Status: AC
  Administered 2018-06-20: 15:00:00
  Filled 2018-06-20: qty 2

## 2018-06-20 MED ORDER — NITROGLYCERIN 0.4 MG SL SUBL
0.8000 mg | SUBLINGUAL_TABLET | SUBLINGUAL | Status: DC | PRN
  Filled 2018-06-20: qty 25

## 2018-06-20 MED ORDER — IOPAMIDOL (ISOVUE-370) INJECTION 76%
80.0000 mL | Freq: Once | INTRAVENOUS | Status: AC | PRN
  Administered 2018-06-20: 80 mL via INTRAVENOUS

## 2018-06-22 ENCOUNTER — Ambulatory Visit
Admission: RE | Admit: 2018-06-22 | Discharge: 2018-06-22 | Disposition: A | Discharge status: Home / Self Care | Payer: 59 | Source: Ambulatory Visit | Attending: Endocrinology | Admitting: Endocrinology

## 2018-06-22 DIAGNOSIS — D443 Neoplasm of uncertain behavior of pituitary gland: Secondary | ICD-10-CM

## 2018-06-22 DIAGNOSIS — E236 Other disorders of pituitary gland: Secondary | ICD-10-CM | POA: Diagnosis not present

## 2018-06-22 DIAGNOSIS — D444 Neoplasm of uncertain behavior of craniopharyngeal duct: Principal | ICD-10-CM

## 2018-06-22 MED ORDER — GADOBENATE DIMEGLUMINE 529 MG/ML IV SOLN
10.0000 mL | Freq: Once | INTRAVENOUS | Status: AC | PRN
  Administered 2018-06-22: 10 mL via INTRAVENOUS

## 2018-06-24 MED FILL — LIALDA 1.2 GM TABLET SA: 1.2 | 30 days supply | Qty: 120 | Fill #3

## 2018-06-24 MED FILL — MESALAMINE 1000 MG SUPP: 1000 | 30 days supply | Qty: 30 | Fill #0

## 2018-06-25 ENCOUNTER — Ambulatory Visit: Payer: 59 | Admitting: Physical Therapy

## 2018-06-26 ENCOUNTER — Encounter: Payer: Self-pay | Admitting: Cardiology

## 2018-06-26 ENCOUNTER — Ambulatory Visit: Payer: 59 | Admitting: Cardiology

## 2018-06-26 VITALS — BP 118/62 | HR 82 | Ht 67.0 in | Wt 187.0 lb

## 2018-06-26 DIAGNOSIS — Q231 Congenital insufficiency of aortic valve: Secondary | ICD-10-CM

## 2018-06-26 DIAGNOSIS — Z8249 Family history of ischemic heart disease and other diseases of the circulatory system: Secondary | ICD-10-CM | POA: Diagnosis not present

## 2018-06-26 DIAGNOSIS — R0789 Other chest pain: Secondary | ICD-10-CM

## 2018-06-26 DIAGNOSIS — R0609 Other forms of dyspnea: Secondary | ICD-10-CM | POA: Diagnosis not present

## 2018-06-26 DIAGNOSIS — I2510 Atherosclerotic heart disease of native coronary artery without angina pectoris: Secondary | ICD-10-CM | POA: Insufficient documentation

## 2018-06-26 MED ORDER — ASPIRIN EC 81 MG PO TBEC: 81.0000 mg | DELAYED_RELEASE_TABLET | Freq: Every day | ORAL | 3 refills | Status: DC

## 2018-06-26 NOTE — Progress Notes (Signed)
Cardiology Office Note:    Date:  06/26/2018   ID:  [name], DOB 26-Sep-1960, MRN [id number]  PCP:  Lanice Shirts, MD  Cardiologist:  Jenean Lindau, MD   Referring MD: Lanice Shirts, *    ASSESSMENT:    1. Chest discomfort   2. Family history of early CAD   3. Bicuspid aortic valve   4. Coronary artery disease involving native coronary artery of native heart without angina pectoris   5. DOE (dyspnea on exertion)    PLAN:    In order of problems listed above:  1. I discussed my findings with the patient at extensive length.  Secondary prevention stressed with the patient.  Importance of compliance with diet and medication stressed and she vocalized understanding. 2. In view of the above findings we will check her blood work including lipids and start her on statin therapy she will start taking aspirin coated 81 mg daily.  Benefits and potential risks of these medications were discussed and questions were  answered to her satisfaction 3. In view of shortness of breath I would like to check a d-dimer and she is agreeable. 4. Patient will be seen in follow-up appointment in 6 months or earlier if the patient has any concerns    Medication Adjustments/Labs and Tests Ordered: Current medicines are reviewed at length with the patient today.  Concerns regarding medicines are outlined above.  Orders Placed This Encounter  Procedures  . Basic metabolic panel  . Hepatic function panel  . D-Dimer, Quantitative   Meds ordered this encounter  Medications  . aspirin EC 81 MG tablet    Sig: Take 1 tablet (81 mg total) by mouth daily.    Dispense:  90 tablet    Refill:  3     No chief complaint on file.    History of Present Illness:    [name] is a 58 y.o. female.  Patient was evaluated by me for shortness of breath symptoms.  This happens on exertion.  Subsequently she underwent CT coronary angiography which was unremarkable except she had a  plaque in the LAD  which was nonobstructive.  At the time of my evaluation, the patient is alert awake oriented and in no distress.  Patient complains of some shortness of breath when she exerts herself.  She appears very anxious about this.  Overall she is an active lady.  History reviewed. No pertinent past medical history.  Past Surgical History:  Procedure Laterality Date  . APPENDECTOMY    . CHOLECYSTECTOMY    . ENDOMETRIAL ABLATION    . LAPAROTOMY    . laproscopic kne    . TONSILLECTOMY      Current Medications: Current Meds  Medication Sig  . mesalamine (LIALDA) 1.2 g EC tablet Take 2 tablets by mouth daily with breakfast.  . Probiotic Product (PROBIOTIC DAILY PO) Take 1 tablet by mouth daily.  Marland Kitchen terconazole (TERAZOL 3) 0.8 % vaginal cream Place 1 applicator vaginally as needed.  . traZODone (DESYREL) 50 MG tablet Take 50 mg by mouth at bedtime as needed for sleep.     Allergies:   Patient has no known allergies.   Social History   Socioeconomic History  . Marital status: Married    Spouse name: Not on file  . Number of children: Not on file  . Years of education: Not on file  . Highest education level: Not on file  Occupational History  . Not on file  Social Needs  . Financial resource strain: Not on file  . Food insecurity:    Worry: Not on file    Inability: Not on file  . Transportation needs:    Medical: Not on file    Non-medical: Not on file  Tobacco Use  . Smoking status: Never Smoker  . Smokeless tobacco: Never Used  Substance and Sexual Activity  . Alcohol use: Yes  . Drug use: No  . Sexual activity: Yes    Birth control/protection: Other-see comments    Comment: endometrial ablation  Lifestyle  . Physical activity:    Days per week: Not on file    Minutes per session: Not on file  . Stress: Not on file  Relationships  . Social connections:    Talks on phone: Not on file    Gets together: Not on file    Attends religious service: Not on  file    Active member of club or organization: Not on file    Attends meetings of clubs or organizations: Not on file    Relationship status: Not on file  Other Topics Concern  . Not on file  Social History Narrative  . Not on file     Family History: The patient's family history includes Diabetes in her brother; Goiter in her mother; Heart attack in her mother; Heart disease in her father; Heart failure in her father; Irregular heart beat in her father and mother; Thyroid disease in her father.  ROS:   Please see the history of present illness.    All other systems reviewed and are negative.  EKGs/Labs/Other Studies Reviewed:    The following studies were reviewed today:    Rece FINDINGS: Non-cardiac: See separate report from [facility].  Pulmonary veins drain normally to the left atrium.  Calcium Score: 0 Agatston units.  Coronary Arteries: Right dominant with no anomalies  LM: No plaque or stenosis.  LAD system: Small area of mixed plaque in the proximal LAD, no significant stenosis.  Circumflex system: No plaque or stenosis.  RCA system: There is misregistration artifact in the mid RCA, but there does not appear to be any plaque or stenosis.  IMPRESSION: 1. Coronary artery calcium score 0 Agatston units, suggesting low risk for future cardiac events.  2. No obstructive coronary disease. There appeared to be a small area of plaque in the proximal LAD but not picked up on calcium scoring.  Dalton Mclean   Electronically Signed   By: Loralie Champagne M.D.   On: 06/20/2018 16:18   Addended by Larey Dresser, MD on 06/20/2018 4:20 PM    Study Result   EXAM: OVER-READ INTERPRETATION  CT CHEST  The following report is an over-read performed by radiologist Dr. Rolm Baptise of [facility] [place] on 06/20/2018. This over-read does not include interpretation of cardiac or coronary anatomy or pathology. The coronary CTA  interpretation by the cardiologist is attached.  COMPARISON:  05/08/2017  FINDINGS: Vascular: Heart is normal size.  Aorta is normal caliber.  Mediastinum/Nodes: No adenopathy in the lower mediastinum or hila.  Lungs/Pleura: Visualized lungs clear.  No effusions.  Upper Abdomen: Imaging into the upper abdomen shows no acute findings.  Musculoskeletal: Bilateral breast implants partially imaged. Chest wall soft tissues unremarkable. No acute bony abnormality.  IMPRESSION: No acute or significant extracardiac abnormality.  Electronically Signed: By: Rolm Baptise M.D. On: 06/20/2018 15:52     nt Labs: 03/26/2018: B Natriuretic Peptide 74.7; Hemoglobin 11.9; Platelets 224 06/19/2018: BUN  16; Creatinine, Ser 0.75; Potassium 3.9; Sodium 140  Recent Lipid Panel No results found for: CHOL, TRIG, HDL, CHOLHDL, VLDL, LDLCALC, LDLDIRECT  Physical Exam:    VS:  BP 118/62 (BP Location: Right Arm, Patient Position: Sitting, Cuff Size: Normal)   Pulse 82   Ht 5\' 7"  (1.702 m)   Wt 187 lb (84.8 kg)   SpO2 98%   BMI 29.29 kg/m     Wt Readings from Last 3 Encounters:  06/26/18 187 lb (84.8 kg)  05/27/18 187 lb (84.8 kg)  03/27/18 183 lb (83 kg)     GEN: Patient is in no acute distress HEENT: Normal NECK: No JVD; No carotid bruits LYMPHATICS: No lymphadenopathy CARDIAC: Hear sounds regular, 2/6 systolic murmur at the apex. RESPIRATORY:  Clear to auscultation without rales, wheezing or rhonchi  ABDOMEN: Soft, non-tender, non-distended MUSCULOSKELETAL:  No edema; No deformity  SKIN: Warm and dry NEUROLOGIC:  Alert and oriented x 3 PSYCHIATRIC:  Normal affect   Signed, Jenean Lindau, MD  06/26/2018 11:03 AM    [place]

## 2018-06-26 NOTE — Patient Instructions (Signed)
Medication Instructions:  Your physician has recommended you make the following change in your medication:  Start: Aspirin 81 mg daily   If you need a refill on your cardiac medications before your next appointment, please call your pharmacy.   Lab work: Your physician recommends that you return for lab work today: bmp, lft d dimer  Return in the next few days fastiing for : lipids   If you have labs (blood work) drawn today and your tests are completely normal, you will receive your results only by: Marland Kitchen MyChart Message (if you have MyChart) OR . A paper copy in the mail If you have any lab test that is abnormal or we need to change your treatment, we will call you to review the results.  Testing/Procedures: None.   Follow-Up: At [facility], you and your health needs are our priority.  As part of our continuing mission to provide you with exceptional heart care, we have created designated Provider Care Teams.  These Care Teams include your primary Cardiologist (physician) and Advanced Practice Providers (APPs -  Physician Assistants and Nurse Practitioners) who all work together to provide you with the care you need, when you need it. You will need a follow up appointment in 6 months.  Please call our office 2 months in advance to schedule this appointment.  You may see No primary care provider on file. or another member of our [employer] in [place]: Jenne Campus, MD . Shirlee More, MD  Any Other Special Instructions Will Be Listed Below (If Applicable).   Aspirin and Your Heart  Aspirin is a medicine that prevents the cells in the blood that are used for clotting, called platelets, from sticking together. Aspirin can be used to help reduce the risk of blood clots, heart attacks, and other heart-related problems. Can I take aspirin? Your health care provider will help you determine whether it is safe and beneficial for you to take aspirin daily. Taking  aspirin daily may be helpful if you:  Have had a heart attack or chest pain.  Are at risk for a heart attack.  Have undergone open-heart surgery, such as coronary artery bypass surgery (CABG).  Have had coronary angioplasty or a stent.  Have had certain types of stroke or transient ischemic attack (TIA).  Have peripheral artery disease (PAD).  Have chronic heart rhythm problems such as atrial fibrillation and cannot take an anticoagulant.  Have valve disease or have had surgery on a valve. What are the risks? Daily use of aspirin can cause side effects. Some of these include:  Bleeding. Bleeding problems can be minor or serious. An example of a minor problem is a cut that does not stop bleeding. An example of a more serious problem is stomach bleeding or, rarely, bleeding into the brain. Your risk of bleeding is increased if you are also taking non-steroidal anti-inflammatory drugs (NSAIDs).  Increased bruising.  Upset stomach.  An allergic reaction. People who have nasal polyps have an increased risk of developing an aspirin allergy. General guidelines  Take aspirin only as told by your health care provider. Make sure that you understand how much you should take and what form you should take. The two forms of aspirin are: ? Non-enteric-coated.This type of aspirin does not have a coating and is absorbed quickly. This type of aspirin also comes in a chewable form. ? Enteric-coated. This type of aspirin has a coating that releases the medicine very slowly. Enteric-coated aspirin might  cause less stomach upset than non-enteric-coated aspirin. This type of aspirin should not be chewed or crushed.  Limit alcohol intake to no more than 1 drink a day for nonpregnant women and 2 drinks a day for men. Drinking alcohol increases your risk of bleeding. One drink equals 12 oz of beer, 5 oz of wine, or 1 oz of hard liquor. Contact a health care provider if you:  Have unusual bleeding or  bruising.  Have stomach pain or nausea.  Have ringing in your ears.  Have an allergic reaction that causes: ? Hives. ? Itchy skin. ? Swelling of the lips, tongue, or face. Get help right away if you:  Notice that your bowel movements are bloody, dark red, or black in color.  Vomit or cough up blood.  Have blood in your urine.  Cough, have noisy breathing (wheeze), or feel short of breath.  Have chest pain, especially if the pain spreads to the arms, back, neck, or jaw.  Have a severe headache, or a headache with confusion, or dizziness. These symptoms may represent a serious problem that is an emergency. Do not wait to see if the symptoms will go away. Get medical help right away. Call your local emergency services (911 in the [place] Do not drive yourself to the hospital. Summary  Aspirin can be used to help reduce the risk of blood clots, heart attacks, and other heart-related problems.  Daily use of aspirin can increase your risk of side effects. Your health care provider will help you determine whether it is safe and beneficial for you to take aspirin daily.  Take aspirin only as told by your health care provider. Make sure that you understand how much you can take and what form you can take. This information is not intended to replace advice given to you by your health care provider. Make sure you discuss any questions you have with your health care provider. Document Released: 03/22/2008 Document Revised: 02/07/2017 Document Reviewed: 02/07/2017 Elsevier Interactive Patient Education  2019 [employer].

## 2018-06-26 NOTE — Addendum Note (Signed)
Addended by: Linna Hoff R on: 06/26/2018 11:07 AM   Modules accepted: Orders

## 2018-06-27 ENCOUNTER — Telehealth: Payer: Self-pay | Admitting: Emergency Medicine

## 2018-06-27 LAB — BASIC METABOLIC PANEL
BUN/Creatinine Ratio: 21 (ref 9–23)
BUN: 18 mg/dL (ref 6–24)
CALCIUM: 9.7 mg/dL (ref 8.7–10.2)
CO2: 27 mmol/L (ref 20–29)
Chloride: 102 mmol/L (ref 96–106)
Creatinine, Ser: 0.85 mg/dL (ref 0.57–1.00)
GFR calc Af Amer: 89 mL/min/{1.73_m2} (ref >59)
GFR calc non Af Amer: 77 mL/min/{1.73_m2} (ref >59)
Glucose: 99 mg/dL (ref 65–99)
Potassium: 4.3 mmol/L (ref 3.5–5.2)
Sodium: 140 mmol/L (ref 134–144)

## 2018-06-27 LAB — D-DIMER, QUANTITATIVE: D-DIMER: 0.26 mg/L FEU (ref 0.00–0.49)

## 2018-06-27 LAB — HEPATIC FUNCTION PANEL
ALT: 22 IU/L (ref 0–32)
AST: 18 IU/L (ref 0–40)
Albumin: 4.4 g/dL (ref 3.8–4.9)
Alkaline Phosphatase: 66 IU/L (ref 39–117)
Bilirubin Total: 0.3 mg/dL (ref 0.0–1.2)
Bilirubin, Direct: 0.08 mg/dL (ref 0.00–0.40)
Total Protein: 6.7 g/dL (ref 6.0–8.5)

## 2018-06-27 NOTE — Telephone Encounter (Signed)
Patient called back and informed patient of the results.

## 2018-06-27 NOTE — Telephone Encounter (Signed)
Left message for patient to return call regarding results  

## 2018-06-30 ENCOUNTER — Ambulatory Visit: Payer: 59 | Attending: Orthopaedic Surgery | Admitting: Physical Therapy

## 2018-06-30 ENCOUNTER — Other Ambulatory Visit: Payer: Self-pay

## 2018-06-30 ENCOUNTER — Encounter: Payer: Self-pay | Admitting: Physical Therapy

## 2018-06-30 DIAGNOSIS — M6281 Muscle weakness (generalized): Secondary | ICD-10-CM | POA: Diagnosis not present

## 2018-06-30 DIAGNOSIS — M5442 Lumbago with sciatica, left side: Secondary | ICD-10-CM | POA: Diagnosis not present

## 2018-06-30 DIAGNOSIS — M5441 Lumbago with sciatica, right side: Secondary | ICD-10-CM | POA: Diagnosis not present

## 2018-06-30 DIAGNOSIS — R29898 Other symptoms and signs involving the musculoskeletal system: Secondary | ICD-10-CM | POA: Diagnosis not present

## 2018-06-30 NOTE — Therapy (Signed)
[place], [place], [address] Phone: [phone]   Fax:  [phone]  Physical Therapy Evaluation  Patient Details  Name: [name] MRN: [id number] Date of Birth: [birth year] Referring Provider (PT): Melrose Nakayama, Md   Encounter Date: 06/30/2018  PT End of Session - 06/30/18 1020    Visit Number  1    Number of Visits  12    Date for PT Re-Evaluation  08/11/18    Authorization Type  [facility]    PT Start Time  1020    PT Stop Time  1102    PT Time Calculation (min)  42 min    Activity Tolerance  Patient tolerated treatment well       History reviewed. No pertinent past medical history.  Past Surgical History:  Procedure Laterality Date  . APPENDECTOMY    . CHOLECYSTECTOMY    . ENDOMETRIAL ABLATION    . LAPAROTOMY    . laproscopic kne    . TONSILLECTOMY      There were no vitals filed for this visit.   Subjective Assessment - 06/30/18 1020    Subjective  Pt reports she developed Lt sided back and leg pain about 5-6 months ago and currently has intermittent pain in bilat lower legs.  She is not able to pin point agrrivating factors .  She also has a new dx involving her heart. she is seeing a specialist tomorrow to have this addressed.  It is impairing her ability to exercise d/t SOB.  Asked pt to talk to MD and see if pain in her lower legs could be related.     Diagnostic tests  xray - L4-5 spondylolisthesis    Patient Stated Goals  tennis, piano    Currently in Pain?  Yes    Pain Score  2     Pain Location  Back    Pain Orientation  Mid;Lower   currently   Pain Descriptors / Indicators  Aching;Sore    Pain Type  Chronic pain    Pain Onset  More than a month ago    Pain Frequency  Constant    Aggravating Factors   pain up to 8/10 at times in the lower leg not sure what brings it on, no consistant pattern    Pain Relieving Factors  sometimes warm water helps.          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    Referring Provider (PT)  Melrose Nakayama, Md    Onset Date/Surgical Date  05/01/18    Hand Dominance  Right    Next MD Visit  PRN    Prior Therapy  not for back      Precautions   Precautions  None      Balance Screen   Has the patient fallen in the past 6 months  No      [place] residence      Prior Function   Level of Independence  Independent    Vocation  Full time employment    Vocation Requirements  physician liason    Leisure  tennis      Observation/Other Assessments   Focus on Therapeutic Outcomes (FOTO)   44% limited      Sensation   Light Touch  Appears Intact    Hot/Cold  Appears Intact  per pt report     Posture/Postural Control   Posture/Postural Control  Postural limitations    Postural Limitations  Increased lumbar lordosis      ROM / Strength   AROM / PROM / Strength  AROM;Strength;PROM      AROM   AROM Assessment Site  Lumbar    Lumbar Flexion  full ROM pain in lumbar with over pressure    Lumbar Extension  NA d/t dx spondylolithesis     Lumbar - Left Side Bend  75% present with pain    Lumbar - Right Rotation  WNL pain with over pressure Rt low back      PROM   PROM Assessment Site  Hip    Right/Left Hip  --   bilat WNL slight pain with over pressure bilat IR quadrant     Strength   Strength Assessment Site  Knee;Ankle;Hip;Lumbar    Right/Left Hip  --   Rt hip grossly 4-/5, Lt 5-/5   Right/Left Knee  --   WNL   Right/Left Ankle  --   WNL   Lumbar Extension  --   poor multifidi     Flexibility   Soft Tissue Assessment /Muscle Length  yes    Hamstrings  WNL    Quadriceps  WNL      Palpation   Spinal mobility  pain with grade II CPA S1-L3 and Rt UPA L4-5    Palpation comment  no tenderness or tightness palpated through low back and bilat buttocks.       Special Tests    Special Tests   Lumbar    Lumbar Tests  Slump Test;Straight Leg Raise      Slump test   Findings  --   unclear if positive bilat      Straight Leg Raise   Findings  Negative                Objective measurements completed on examination: See above findings.      [place] [facility] PT Treatment/Exercise - 06/30/18 0001      Exercises   Exercises  Lumbar      Lumbar Exercises: Stretches   Double Knee to Chest Stretch  1 rep;30 seconds      Lumbar Exercises: Prone   Other Prone Lumbar Exercises  10 reps pelvic press, then with SLR and bent knee hip ext             PT Education - 06/30/18 1406    Education Details  HEP     Person(s) Educated  Patient    Methods  Explanation;Handout    Comprehension  Returned demonstration;Verbalized understanding;Verbal cues required          PT Long Term Goals - 06/30/18 1413      PT LONG TERM GOAL #1   Title  I with advanced HEP for core strength and stability ( 08/11/2018)     Time  6    Period  Weeks    Status  New    Target Date  08/11/18      PT LONG TERM GOAL #2   Title  play/simulated tennis without back or LE pain ( 08/11/2018)     Time  6    Period  Weeks    Status  New    Target Date  08/11/18      PT LONG TERM GOAL #3   Title  pt will report overall decrease =/> 75% in her legs and back with  daily acitivty ( 08/11/2018)     Time  6    Period  Weeks    Status  New    Target Date  08/11/18      PT LONG TERM GOAL #4   Title  improve FOTO =/< 29% limited ( 08/11/2018)    Time  6    Period  Weeks    Status  New    Target Date  08/11/18      PT LONG TERM GOAL #5   Title  demo Rt hip strength =/> 5-/5 to assist with mobility ( 08/11/2018)     Time  6    Period  Weeks    Status  New    Target Date  08/11/18             Plan - 06/30/18 1406    Clinical Impression Statement  [name] presents with 5-6 month h/o low back and LE pain.  She also has new cardiac issues that are being addressed with a specialist.  She  has pain with overpressure in her back, some weakness in the Rt hip/core and pain into her legs.  Her symptoms do not present as a typical radiculopathy/spondolylithesis patient, asked her to consult with cardiac specialist to see if symptoms could be related to this. She would benefit from tx to address her deficits, reduce pain and restore function.     Personal Factors and Comorbidities  Comorbidity 2;Comorbidity 1;Fitness    Examination-Activity Limitations  Sleep;Other    Examination-Participation Restrictions  Other    Stability/Clinical Decision Making  Evolving/Moderate complexity    Clinical Decision Making  Moderate    Rehab Potential  Fair    PT Frequency  2x / week    PT Duration  6 weeks    PT Treatment/Interventions  ADLs/Self Care Home Management;Cryotherapy;Electrical Stimulation;Ultrasound;Moist Heat;Iontophoresis 4mg /ml Dexamethasone;Therapeutic activities;Therapeutic exercise;Manual techniques;Patient/family education;Passive range of motion;Dry needling;Taping;Spinal Manipulations;Joint Manipulations;Functional mobility training    PT Next Visit Plan  hip and core strengthening progress to functional activities     Consulted and Agree with Plan of Care  Patient       Patient will benefit from skilled therapeutic intervention in order to improve the following deficits and impairments:  Pain, Decreased range of motion, Decreased strength, Difficulty walking  Visit Diagnosis: Muscle weakness (generalized) - Plan: PT plan of care cert/re-cert  Acute bilateral low back pain with bilateral sciatica - Plan: PT plan of care cert/re-cert  Other symptoms and signs involving the musculoskeletal system - Plan: PT plan of care cert/re-cert     Problem List Patient Active Problem List   Diagnosis Date Noted  . CAD (coronary artery disease) 06/26/2018  . DOE (dyspnea on exertion) 06/26/2018  . Bicuspid aortic valve 05/27/2018  . Chest discomfort 03/27/2018  . Laryngopharyngeal  reflux (LPR) 08/20/2017  . Ulcerative colitis ([place]) 09/21/2015  . Overweight (BMI 25.0-29.9) 09/21/2015  . Cellulitis and abscess 09/21/2015  . Lymphocytic colitis 10/17/2011  . Depression, postpartum 10/17/2011    Jeral Pinch PT  06/30/2018, 2:21 PM  [facility] [address] [place], [place], [address] Phone: [phone]   Fax:  [phone]  Name: [name] MRN: [id number] Date of Birth: 1961-04-15

## 2018-07-01 DIAGNOSIS — R0609 Other forms of dyspnea: Secondary | ICD-10-CM | POA: Diagnosis not present

## 2018-07-01 DIAGNOSIS — R002 Palpitations: Secondary | ICD-10-CM | POA: Diagnosis not present

## 2018-07-01 MED FILL — PRAVASTATIN SODIUM 20 MG TA: 20 | 30 days supply | Qty: 30 | Fill #0

## 2018-07-04 DIAGNOSIS — R002 Palpitations: Secondary | ICD-10-CM | POA: Diagnosis not present

## 2018-07-04 DIAGNOSIS — R0609 Other forms of dyspnea: Secondary | ICD-10-CM | POA: Diagnosis not present

## 2018-07-08 MED FILL — LIALDA 1.2 GM TABLET SA: 1.2 | 90 days supply | Qty: 360 | Fill #4

## 2018-07-08 MED FILL — MESALAMINE 1000 MG SUPP: 1000 | 90 days supply | Qty: 90 | Fill #1

## 2018-07-08 MED FILL — traZODone HCL 50 MG TABS: 50 | 90 days supply | Qty: 270 | Fill #2

## 2018-07-15 ENCOUNTER — Ambulatory Visit: Payer: 59 | Admitting: Physical Therapy

## 2018-07-18 ENCOUNTER — Ambulatory Visit: Payer: 59 | Admitting: Physical Therapy

## 2018-07-21 ENCOUNTER — Ambulatory Visit: Payer: 59 | Admitting: Physical Therapy

## 2018-07-23 ENCOUNTER — Telehealth: Payer: Self-pay | Admitting: Physical Therapy

## 2018-07-23 NOTE — Telephone Encounter (Signed)
[name]  was contacted today regarding the temporary reduction of OP Rehab Services due to concerns for community transmission of Covid-19.    Therapist advised the patient to continue to perform their HEP and assured they had no unanswered questions at this time. She has been performing her exercises at home.  The patient was offered and declined the continuation in their POC by using methods such as an e-visit, virtual check in, or telehealth visit.    Outpatient Rehabilitation Services will follow up with this client when we are able to safely resume care at the [facility] in person.   Patient is aware we can be reached by telephone during limited business hours in the meantime.

## 2018-07-24 ENCOUNTER — Ambulatory Visit: Payer: 59 | Admitting: Physical Therapy

## 2018-07-28 ENCOUNTER — Ambulatory Visit: Payer: 59 | Admitting: Physical Therapy

## 2018-07-30 ENCOUNTER — Ambulatory Visit: Payer: 59 | Admitting: Physical Therapy

## 2018-08-05 ENCOUNTER — Encounter: Payer: Self-pay | Admitting: Physical Therapy

## 2018-08-07 ENCOUNTER — Encounter: Payer: Self-pay | Admitting: Physical Therapy

## 2018-08-13 ENCOUNTER — Encounter: Payer: Self-pay | Admitting: Physical Therapy

## 2018-08-18 DIAGNOSIS — M545 Low back pain: Secondary | ICD-10-CM | POA: Diagnosis not present

## 2018-08-18 MED FILL — predniSONE 5 MG TABS: 5 | 6 days supply | Qty: 21 | Fill #0

## 2018-08-18 MED FILL — tiZANidine HCL 2 MG TABS: 2 | 10 days supply | Qty: 30 | Fill #0

## 2018-08-26 ENCOUNTER — Telehealth: Payer: Self-pay | Admitting: Physical Therapy

## 2018-08-26 NOTE — Telephone Encounter (Signed)
Left message regarding continuation of PT POC, requested call back to discuss schedule vs d/c.  Jonelle Bann C. Gregg Winchell PT, DPT 08/26/18 1:32 PM

## 2018-08-29 ENCOUNTER — Other Ambulatory Visit: Payer: Self-pay | Admitting: Orthopaedic Surgery

## 2018-08-29 DIAGNOSIS — M545 Low back pain, unspecified: Secondary | ICD-10-CM

## 2018-09-04 ENCOUNTER — Ambulatory Visit
Admission: RE | Admit: 2018-09-04 | Discharge: 2018-09-04 | Disposition: A | Discharge status: Home / Self Care | Payer: 59 | Source: Ambulatory Visit | Attending: Orthopaedic Surgery | Admitting: Orthopaedic Surgery

## 2018-09-04 ENCOUNTER — Other Ambulatory Visit: Payer: Self-pay

## 2018-09-04 DIAGNOSIS — M48061 Spinal stenosis, lumbar region without neurogenic claudication: Secondary | ICD-10-CM | POA: Diagnosis not present

## 2018-09-04 DIAGNOSIS — M545 Low back pain, unspecified: Secondary | ICD-10-CM

## 2018-09-10 DIAGNOSIS — M545 Low back pain: Secondary | ICD-10-CM | POA: Diagnosis not present

## 2018-10-06 DIAGNOSIS — M48061 Spinal stenosis, lumbar region without neurogenic claudication: Secondary | ICD-10-CM | POA: Diagnosis not present

## 2018-10-06 DIAGNOSIS — M545 Low back pain: Secondary | ICD-10-CM | POA: Diagnosis not present

## 2018-10-06 DIAGNOSIS — M4316 Spondylolisthesis, lumbar region: Secondary | ICD-10-CM | POA: Diagnosis not present

## 2018-10-06 DIAGNOSIS — M5126 Other intervertebral disc displacement, lumbar region: Secondary | ICD-10-CM | POA: Diagnosis not present

## 2018-10-06 DIAGNOSIS — M5416 Radiculopathy, lumbar region: Secondary | ICD-10-CM | POA: Diagnosis not present

## 2018-10-06 DIAGNOSIS — Z6828 Body mass index (BMI) 28.0-28.9, adult: Secondary | ICD-10-CM | POA: Diagnosis not present

## 2018-10-06 MED FILL — traMADol HCL 50 MG TABS: 50 | 15 days supply | Qty: 60 | Fill #0

## 2018-10-22 ENCOUNTER — Other Ambulatory Visit: Payer: Self-pay

## 2018-10-22 DIAGNOSIS — Z20822 Contact with and (suspected) exposure to covid-19: Secondary | ICD-10-CM

## 2018-10-22 DIAGNOSIS — R6889 Other general symptoms and signs: Secondary | ICD-10-CM | POA: Diagnosis not present

## 2018-10-23 DIAGNOSIS — Z13220 Encounter for screening for lipoid disorders: Secondary | ICD-10-CM | POA: Diagnosis not present

## 2018-10-23 DIAGNOSIS — Z1321 Encounter for screening for nutritional disorder: Secondary | ICD-10-CM | POA: Diagnosis not present

## 2018-10-23 DIAGNOSIS — M545 Low back pain: Secondary | ICD-10-CM | POA: Diagnosis not present

## 2018-10-23 DIAGNOSIS — Z13228 Encounter for screening for other metabolic disorders: Secondary | ICD-10-CM | POA: Diagnosis not present

## 2018-10-23 DIAGNOSIS — Z1329 Encounter for screening for other suspected endocrine disorder: Secondary | ICD-10-CM | POA: Diagnosis not present

## 2018-10-23 DIAGNOSIS — Z131 Encounter for screening for diabetes mellitus: Secondary | ICD-10-CM | POA: Diagnosis not present

## 2018-10-23 DIAGNOSIS — Z130 Encounter for screening for diseases of the blood and blood-forming organs and certain disorders involving the immune mechanism: Secondary | ICD-10-CM | POA: Diagnosis not present

## 2018-10-28 LAB — NOVEL CORONAVIRUS, NAA: SARS-CoV-2, NAA: NOT DETECTED

## 2018-11-05 DIAGNOSIS — H5213 Myopia, bilateral: Secondary | ICD-10-CM | POA: Diagnosis not present

## 2018-11-05 DIAGNOSIS — H52203 Unspecified astigmatism, bilateral: Secondary | ICD-10-CM | POA: Diagnosis not present

## 2018-11-05 DIAGNOSIS — H524 Presbyopia: Secondary | ICD-10-CM | POA: Diagnosis not present

## 2018-11-10 MED FILL — traZODone HCL 50 MG TABS: 50 | 90 days supply | Qty: 270 | Fill #3

## 2018-11-27 DIAGNOSIS — M4316 Spondylolisthesis, lumbar region: Secondary | ICD-10-CM | POA: Diagnosis not present

## 2018-11-27 DIAGNOSIS — M48061 Spinal stenosis, lumbar region without neurogenic claudication: Secondary | ICD-10-CM | POA: Diagnosis not present

## 2018-11-27 DIAGNOSIS — M5416 Radiculopathy, lumbar region: Secondary | ICD-10-CM | POA: Diagnosis not present

## 2018-11-27 MED FILL — GABAPENTIN 100 MG CAPSULE: 100 | 30 days supply | Qty: 60 | Fill #0

## 2018-12-11 ENCOUNTER — Other Ambulatory Visit: Payer: Self-pay | Admitting: Adult Health

## 2018-12-11 NOTE — Telephone Encounter (Signed)
Has she been seen lately or does she need an appt.

## 2018-12-12 MED FILL — buPROPion HCL ER (XL) 150 M: 150 | 30 days supply | Qty: 30 | Fill #0

## 2018-12-16 ENCOUNTER — Encounter: Payer: Self-pay | Admitting: Adult Health

## 2018-12-16 ENCOUNTER — Other Ambulatory Visit: Payer: Self-pay

## 2018-12-16 ENCOUNTER — Ambulatory Visit (INDEPENDENT_AMBULATORY_CARE_PROVIDER_SITE_OTHER): Payer: 59 | Admitting: Adult Health

## 2018-12-16 DIAGNOSIS — F411 Generalized anxiety disorder: Secondary | ICD-10-CM

## 2018-12-16 DIAGNOSIS — F331 Major depressive disorder, recurrent, moderate: Secondary | ICD-10-CM | POA: Diagnosis not present

## 2018-12-16 DIAGNOSIS — G4700 Insomnia, unspecified: Secondary | ICD-10-CM | POA: Diagnosis not present

## 2018-12-16 MED ORDER — TRAZODONE HCL 50 MG PO TABS: ORAL_TABLET | ORAL | 1 refills | Status: DC

## 2018-12-16 MED ORDER — DULOXETINE HCL 30 MG PO CPEP: 30.0000 mg | ORAL_CAPSULE | Freq: Two times a day (BID) | ORAL | 1 refills | Status: DC

## 2018-12-16 MED ORDER — BUPROPION HCL ER (XL) 150 MG PO TB24: ORAL_TABLET | ORAL | 1 refills | Status: DC

## 2018-12-16 MED FILL — DULoxetine HCL 30 MG CPEP: 30 | 90 days supply | Qty: 180 | Fill #0

## 2018-12-16 NOTE — Progress Notes (Signed)
CORTLYN HEGEL [id number] Nov 10, 1960 58 y.o.  Subjective:   Patient ID:  [name] is a 58 y.o. (DOB [birth year]) female.  Chief Complaint: No chief complaint on file.   HPI [name] presents to the office today for follow-up of depression, anxiety, and insomnia.   Describes mood today as "ok". Pleasant. Mood symptoms - reports some mild depression, anxiety, and irritability - related to back pain and the physical limitations imposed. Stating "I've been doing fantastic". Started having back issues over past 6 months - L4 and L5 spinal stenosis. Has started having epidural injections- prednisone - "those have helped me". Feels like she may want to "restart previous medications or at least have the option"  Some mild depression and anxiety. Typically feels great "emotionally". Has been off medications for over a hear and a half.  Stable interest and motivation.  Energy levels mostly stable. Active, but does not have a regular exercise routine with physical limitations. 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 Enjoys some usual interests and activities. Spending time with family. Appetite adequate. Weight stable. Sleeps well most nights. Averages 6 - 8 hours - depends on pain - getting comfortable. Focus and concentration stable. Completing tasks. Managing aspects of household.  Denies SI or HI.  Denies AH or VH.  Review of Systems:  Review of Systems  Musculoskeletal: Negative for gait problem.  Neurological: Negative for tremors.  Psychiatric/Behavioral:       Please refer to HPI    Medications: I have reviewed the patient's current medications.  Current Outpatient Medications  Medication Sig Dispense Refill  . aspirin EC 81 MG tablet Take 1 tablet (81 mg total) by mouth daily. 90 tablet 3  . buPROPion (WELLBUTRIN XL) 150 MG 24 hr tablet TAKE ONE TABLET BY MOUTH EACH MORNING 30 tablet 0  . mesalamine (LIALDA) 1.2 g EC tablet Take  2 tablets by mouth daily with breakfast.    . Probiotic Product (PROBIOTIC DAILY PO) Take 1 tablet by mouth daily.    Marland Kitchen terconazole (TERAZOL 3) 0.8 % vaginal cream Place 1 applicator vaginally as needed.    . traZODone (DESYREL) 50 MG tablet Take 50 mg by mouth at bedtime as needed for sleep.     No current facility-administered medications for this visit.     Medication Side Effects: None  Allergies: No Known Allergies  No past medical history on file.  Family History  Problem Relation Age of Onset  . Goiter Mother   . Irregular heart beat Mother        Afibrillation  . Heart attack Mother   . Heart failure Father   . Thyroid disease Father   . Irregular heart beat Father        Afibrillation  . Heart disease Father   . Diabetes Brother     Social History   Socioeconomic History  . Marital status: Married    Spouse name: Not on file  . Number of children: Not on file  . Years of education: Not on file  . Highest education level: Not on file  Occupational History  . Not on file  Social Needs  . Financial resource strain: Not on file  . Food insecurity    Worry: Not on file    Inability: Not on file  . Transportation needs    Medical: Not on file    Non-medical: Not on file  Tobacco Use  . Smoking status: Never Smoker  .  Smokeless tobacco: Never Used  Substance and Sexual Activity  . Alcohol use: Yes  . Drug use: No  . Sexual activity: Yes    Birth control/protection: Other-see comments    Comment: endometrial ablation  Lifestyle  . Physical activity    Days per week: Not on file    Minutes per session: Not on file  . Stress: Not on file  Relationships  . Social Herbalist on phone: Not on file    Gets together: Not on file    Attends religious service: Not on file    Active member of club or organization: Not on file    Attends meetings of clubs or organizations: Not on file    Relationship status: Not on file  . Intimate partner violence     Fear of current or ex partner: Not on file    Emotionally abused: Not on file    Physically abused: Not on file    Forced sexual activity: Not on file  Other Topics Concern  . Not on file  Social History Narrative  . Not on file    Past Medical History, Surgical history, Social history, and Family history were reviewed and updated as appropriate.   Please see review of systems for further details on the patient's review from today.   Objective:   Physical Exam:  There were no vitals taken for this visit.  Physical Exam Constitutional:      General: She is not in acute distress.    Appearance: She is well-developed.  Musculoskeletal:        General: No deformity.  Neurological:     Mental Status: She is alert and oriented to person, place, and time.     Coordination: Coordination normal.  Psychiatric:        Attention and Perception: Attention and perception normal. She does not perceive auditory or visual hallucinations.        Mood and Affect: Mood normal. Mood is not anxious or depressed. Affect is not labile, blunt, angry or inappropriate.        Speech: Speech normal.        Behavior: Behavior normal.        Thought Content: Thought content normal. Thought content is not paranoid or delusional. Thought content does not include homicidal or suicidal ideation. Thought content does not include homicidal or suicidal plan.        Cognition and Memory: Cognition and memory normal.        Judgment: Judgment normal.     Comments: Insight intact     Lab Review:     Component Value Date/Time   NA 140 06/26/2018 1115   K 4.3 06/26/2018 1115   CL 102 06/26/2018 1115   CO2 27 06/26/2018 1115   GLUCOSE 99 06/26/2018 1115   GLUCOSE 98 03/26/2018 1806   BUN 18 06/26/2018 1115   CREATININE 0.85 06/26/2018 1115   CALCIUM 9.7 06/26/2018 1115   CALCIUM 9.5 05/01/2016   PROT 6.7 06/26/2018 1115   ALBUMIN 4.4 06/26/2018 1115   AST 18 06/26/2018 1115   ALT 22 06/26/2018 1115    ALKPHOS 66 06/26/2018 1115   BILITOT 0.3 06/26/2018 1115   GFRNONAA 77 06/26/2018 1115   GFRAA 89 06/26/2018 1115       Component Value Date/Time   WBC 7.4 03/26/2018 1806   RBC 4.23 03/26/2018 1806   HGB 11.9 (L) 03/26/2018 1806   HCT 38.1 03/26/2018 1806   PLT 224  03/26/2018 1806   MCV 90.1 03/26/2018 1806   MCH 28.1 03/26/2018 1806   MCHC 31.2 03/26/2018 1806   RDW 13.2 03/26/2018 1806   LYMPHSABS 1.8 03/26/2018 1806   MONOABS 0.6 03/26/2018 1806   EOSABS 0.1 03/26/2018 1806   BASOSABS 0.0 03/26/2018 1806    No results found for: POCLITH, LITHIUM   No results found for: PHENYTOIN, PHENOBARB, VALPROATE, CBMZ   .res Assessment: Plan:    Plan:  1. Add Wellbutrin XL 150mg  x 7, then 300mg  daily  2. Add Cymbalta 30mg  daily x 7 days, then 2 daily  3. Add Trazadone 50mg  - 1 to 3 tabs at hs  RTC 6 months  Patient advised to contact office with any questions, adverse effects, or acute worsening in signs and symptoms.   There are no diagnoses linked to this encounter.   Please see After Visit Summary for patient specific instructions.  Future Appointments  Date Time Provider [place]  12/16/2018  4:00 PM Denora Wysocki, Berdie Ogren, NP CP-CP None    No orders of the defined types were placed in this encounter.   -------------------------------

## 2018-12-25 ENCOUNTER — Ambulatory Visit: Payer: 59 | Admitting: Cardiology

## 2019-01-02 DIAGNOSIS — Q231 Congenital insufficiency of aortic valve: Secondary | ICD-10-CM | POA: Diagnosis not present

## 2019-01-13 ENCOUNTER — Other Ambulatory Visit: Payer: Self-pay

## 2019-01-13 DIAGNOSIS — Z20822 Contact with and (suspected) exposure to covid-19: Secondary | ICD-10-CM

## 2019-01-13 DIAGNOSIS — R6889 Other general symptoms and signs: Secondary | ICD-10-CM | POA: Diagnosis not present

## 2019-01-14 DIAGNOSIS — D1801 Hemangioma of skin and subcutaneous tissue: Secondary | ICD-10-CM | POA: Diagnosis not present

## 2019-01-14 DIAGNOSIS — Z23 Encounter for immunization: Secondary | ICD-10-CM | POA: Diagnosis not present

## 2019-01-14 DIAGNOSIS — L821 Other seborrheic keratosis: Secondary | ICD-10-CM | POA: Diagnosis not present

## 2019-01-14 DIAGNOSIS — L814 Other melanin hyperpigmentation: Secondary | ICD-10-CM | POA: Diagnosis not present

## 2019-01-14 DIAGNOSIS — D225 Melanocytic nevi of trunk: Secondary | ICD-10-CM | POA: Diagnosis not present

## 2019-01-14 DIAGNOSIS — D220 Melanocytic nevi of lip: Secondary | ICD-10-CM | POA: Diagnosis not present

## 2019-01-14 DIAGNOSIS — L84 Corns and callosities: Secondary | ICD-10-CM | POA: Diagnosis not present

## 2019-01-14 LAB — NOVEL CORONAVIRUS, NAA: SARS-CoV-2, NAA: NOT DETECTED

## 2019-01-28 ENCOUNTER — Other Ambulatory Visit: Payer: Self-pay

## 2019-01-28 DIAGNOSIS — Z20828 Contact with and (suspected) exposure to other viral communicable diseases: Secondary | ICD-10-CM | POA: Diagnosis not present

## 2019-01-28 DIAGNOSIS — Z20822 Contact with and (suspected) exposure to covid-19: Secondary | ICD-10-CM

## 2019-01-30 LAB — NOVEL CORONAVIRUS, NAA: SARS-CoV-2, NAA: NOT DETECTED

## 2019-02-24 MED FILL — traZODone HCL 50 MG TABS: 50 | 90 days supply | Qty: 270 | Fill #0

## 2019-03-16 DIAGNOSIS — Z13820 Encounter for screening for osteoporosis: Secondary | ICD-10-CM | POA: Diagnosis not present

## 2019-04-01 MED FILL — LIALDA 1.2 GM TABLET SA: 1.2 | 90 days supply | Qty: 360 | Fill #0

## 2019-04-02 ENCOUNTER — Ambulatory Visit (INDEPENDENT_AMBULATORY_CARE_PROVIDER_SITE_OTHER): Payer: 59 | Admitting: Adult Health

## 2019-04-02 ENCOUNTER — Encounter: Payer: Self-pay | Admitting: Adult Health

## 2019-04-02 ENCOUNTER — Other Ambulatory Visit: Payer: Self-pay

## 2019-04-02 DIAGNOSIS — F411 Generalized anxiety disorder: Secondary | ICD-10-CM

## 2019-04-02 DIAGNOSIS — F331 Major depressive disorder, recurrent, moderate: Secondary | ICD-10-CM

## 2019-04-02 DIAGNOSIS — G4700 Insomnia, unspecified: Secondary | ICD-10-CM

## 2019-04-02 NOTE — Progress Notes (Signed)
[name] [id number] 02/13/1961 58 y.o.  Subjective:   Patient ID:  [name] is a 58 y.o. (DOB 1960/05/02) female.  Chief Complaint: No chief complaint on file.   HPI [name] presents to the office today for follow-up of depression, anxiety, and insomnia.   Describes mood today as "ok". Pleasant. Mood symptoms - denies depression, anxiety, and irritability. Stating "I'm doing great". Feels like Cymbalta has been helpful for hip pain - has titrated to 60mg  daily. Married x 2 weeks ago. Daughter 16 driving - having some "issues" with implications of 0000000. Stable interest and motivation.  Energy levels stable. Active, but does not have a regular exercise routine with physical limitations. Works full time - Retail banker for [employer].  Enjoys some usual interests and activities. Spending time with family - husband and daughter. Talking with friends and family.  Appetite adequate. Weight stable. Sleeps well most nights. Averages 6 - 8 hours - depends on pain - getting comfortable. Focus and concentration stable. Completing tasks. Managing aspects of household.  Denies SI or HI. Denies AH or VH.  Review of Systems:  Review of Systems  Musculoskeletal: Negative for gait problem.  Neurological: Negative for tremors.  Psychiatric/Behavioral:       Please refer to HPI    Medications: I have reviewed the patient's current medications.  Current Outpatient Medications  Medication Sig Dispense Refill  . aspirin EC 81 MG tablet Take 1 tablet (81 mg total) by mouth daily. 90 tablet 3  . buPROPion (WELLBUTRIN XL) 150 MG 24 hr tablet TAKE TWO TABLETS BY MOUTH EACH MORNING 180 tablet 1  . DULoxetine (CYMBALTA) 30 MG capsule Take 1 capsule (30 mg total) by mouth 2 (two) times daily. 180 capsule 1  . mesalamine (LIALDA) 1.2 g EC tablet Take 2 tablets by mouth daily with breakfast.    . Probiotic Product (PROBIOTIC DAILY PO) Take 1 tablet by mouth daily.    Marland Kitchen  terconazole (TERAZOL 3) 0.8 % vaginal cream Place 1 applicator vaginally as needed.    . traZODone (DESYREL) 50 MG tablet Take one to three tablets at bedtime. 270 tablet 1   No current facility-administered medications for this visit.    Medication Side Effects: None  Allergies: No Known Allergies  No past medical history on file.  Family History  Problem Relation Age of Onset  . Goiter Mother   . Irregular heart beat Mother        Afibrillation  . Heart attack Mother   . Heart failure Father   . Thyroid disease Father   . Irregular heart beat Father        Afibrillation  . Heart disease Father   . Diabetes Brother     Social History   Socioeconomic History  . Marital status: Married    Spouse name: Not on file  . Number of children: Not on file  . Years of education: Not on file  . Highest education level: Not on file  Occupational History  . Not on file  Tobacco Use  . Smoking status: Never Smoker  . Smokeless tobacco: Never Used  Substance and Sexual Activity  . Alcohol use: Yes  . Drug use: No  . Sexual activity: Yes    Birth control/protection: Other-see comments    Comment: endometrial ablation  Other Topics Concern  . Not on file  Social History Narrative  . Not on file   Social Determinants of Health   Financial Resource Strain:   .  Difficulty of Paying Living Expenses: Not on file  Food Insecurity:   . Worried About Charity fundraiser in the Last Year: Not on file  . Ran Out of Food in the Last Year: Not on file  Transportation Needs:   . Lack of Transportation (Medical): Not on file  . Lack of Transportation (Non-Medical): Not on file  Physical Activity:   . Days of Exercise per Week: Not on file  . Minutes of Exercise per Session: Not on file  Stress:   . Feeling of Stress : Not on file  Social Connections:   . Frequency of Communication with Friends and Family: Not on file  . Frequency of Social Gatherings with Friends and Family: Not  on file  . Attends Religious Services: Not on file  . Active Member of Clubs or Organizations: Not on file  . Attends Archivist Meetings: Not on file  . Marital Status: Not on file  Intimate Partner Violence:   . Fear of Current or Ex-Partner: Not on file  . Emotionally Abused: Not on file  . Physically Abused: Not on file  . Sexually Abused: Not on file    Past Medical History, Surgical history, Social history, and Family history were reviewed and updated as appropriate.   Please see review of systems for further details on the patient's review from today.   Objective:   Physical Exam:  There were no vitals taken for this visit.  Physical Exam Constitutional:      General: She is not in acute distress.    Appearance: She is well-developed.  Musculoskeletal:        General: No deformity.  Neurological:     Mental Status: She is alert and oriented to person, place, and time.     Coordination: Coordination normal.  Psychiatric:        Attention and Perception: Attention and perception normal. She does not perceive auditory or visual hallucinations.        Mood and Affect: Mood normal. Mood is not anxious or depressed. Affect is not labile, blunt, angry or inappropriate.        Speech: Speech normal.        Behavior: Behavior normal.        Thought Content: Thought content normal. Thought content is not paranoid or delusional. Thought content does not include homicidal or suicidal ideation. Thought content does not include homicidal or suicidal plan.        Cognition and Memory: Cognition and memory normal.        Judgment: Judgment normal.     Comments: Insight intact     Lab Review:     Component Value Date/Time   NA 140 06/26/2018 1115   K 4.3 06/26/2018 1115   CL 102 06/26/2018 1115   CO2 27 06/26/2018 1115   GLUCOSE 99 06/26/2018 1115   GLUCOSE 98 03/26/2018 1806   BUN 18 06/26/2018 1115   CREATININE 0.85 06/26/2018 1115   CALCIUM 9.7 06/26/2018 1115    CALCIUM 9.5 05/01/2016 0000   PROT 6.7 06/26/2018 1115   ALBUMIN 4.4 06/26/2018 1115   AST 18 06/26/2018 1115   ALT 22 06/26/2018 1115   ALKPHOS 66 06/26/2018 1115   BILITOT 0.3 06/26/2018 1115   GFRNONAA 77 06/26/2018 1115   GFRAA 89 06/26/2018 1115       Component Value Date/Time   WBC 7.4 03/26/2018 1806   RBC 4.23 03/26/2018 1806   HGB 11.9 (L) 03/26/2018 1806  HCT 38.1 03/26/2018 1806   PLT 224 03/26/2018 1806   MCV 90.1 03/26/2018 1806   MCH 28.1 03/26/2018 1806   MCHC 31.2 03/26/2018 1806   RDW 13.2 03/26/2018 1806   LYMPHSABS 1.8 03/26/2018 1806   MONOABS 0.6 03/26/2018 1806   EOSABS 0.1 03/26/2018 1806   BASOSABS 0.0 03/26/2018 1806    No results found for: POCLITH, LITHIUM   No results found for: PHENYTOIN, PHENOBARB, VALPROATE, CBMZ   .res Assessment: Plan:    Plan:  Cymbalta 60mg  daily. Trazadone 50mg  - 1 to 3 tabs at hs  RTC 6 months  Patient advised to contact office with any questions, adverse effects, or acute worsening in signs and symptoms.  Diagnoses and all orders for this visit:  Major depressive disorder, recurrent episode, moderate (HCC)  Generalized anxiety disorder  Insomnia, unspecified type     Please see After Visit Summary for patient specific instructions.  Future Appointments  Date Time Provider [place]  10/01/2019  9:20 AM Ikey Omary, Berdie Ogren, NP CP-CP None    No orders of the defined types were placed in this encounter.   -------------------------------

## 2019-05-05 ENCOUNTER — Other Ambulatory Visit: Payer: Self-pay | Admitting: Endocrinology

## 2019-05-05 DIAGNOSIS — D443 Neoplasm of uncertain behavior of pituitary gland: Secondary | ICD-10-CM

## 2019-05-29 ENCOUNTER — Other Ambulatory Visit: Payer: 59

## 2019-05-29 MED FILL — ALPRAZolam 0.5 MG TABS: 0.5 | 1 days supply | Qty: 1 | Fill #0

## 2019-06-01 DIAGNOSIS — Z6828 Body mass index (BMI) 28.0-28.9, adult: Secondary | ICD-10-CM | POA: Diagnosis not present

## 2019-06-01 DIAGNOSIS — Z01419 Encounter for gynecological examination (general) (routine) without abnormal findings: Secondary | ICD-10-CM | POA: Diagnosis not present

## 2019-06-01 MED FILL — PHENTERMINE 37.5 MG TABLET: 37.5 | 30 days supply | Qty: 30 | Fill #0

## 2019-06-01 MED FILL — ALPRAZolam 0.5 MG TABS: 0.5 | 1 days supply | Qty: 1 | Fill #1

## 2019-06-02 DIAGNOSIS — Z01419 Encounter for gynecological examination (general) (routine) without abnormal findings: Secondary | ICD-10-CM | POA: Diagnosis not present

## 2019-06-04 MED FILL — DULoxetine HCL 30 MG CPEP: 30 | 90 days supply | Qty: 180 | Fill #1

## 2019-06-09 ENCOUNTER — Other Ambulatory Visit: Payer: Self-pay

## 2019-06-09 ENCOUNTER — Ambulatory Visit
Admission: RE | Admit: 2019-06-09 | Discharge: 2019-06-09 | Disposition: A | Discharge status: Home / Self Care | Payer: 59 | Source: Ambulatory Visit | Attending: Endocrinology | Admitting: Endocrinology

## 2019-06-09 DIAGNOSIS — D443 Neoplasm of uncertain behavior of pituitary gland: Secondary | ICD-10-CM | POA: Diagnosis not present

## 2019-06-09 DIAGNOSIS — D353 Benign neoplasm of craniopharyngeal duct: Secondary | ICD-10-CM | POA: Diagnosis not present

## 2019-06-09 DIAGNOSIS — D444 Neoplasm of uncertain behavior of craniopharyngeal duct: Secondary | ICD-10-CM

## 2019-06-09 MED ORDER — GADOBENATE DIMEGLUMINE 529 MG/ML IV SOLN
9.0000 mL | Freq: Once | INTRAVENOUS | Status: AC | PRN
  Administered 2019-06-09: 9 mL via INTRAVENOUS

## 2019-06-16 DIAGNOSIS — E559 Vitamin D deficiency, unspecified: Secondary | ICD-10-CM | POA: Diagnosis not present

## 2019-06-16 DIAGNOSIS — D443 Neoplasm of uncertain behavior of pituitary gland: Secondary | ICD-10-CM | POA: Diagnosis not present

## 2019-06-16 MED FILL — traZODone HCL 50 MG TABS: 50 | 90 days supply | Qty: 270 | Fill #1

## 2019-06-17 DIAGNOSIS — D443 Neoplasm of uncertain behavior of pituitary gland: Secondary | ICD-10-CM | POA: Diagnosis not present

## 2019-06-23 DIAGNOSIS — E559 Vitamin D deficiency, unspecified: Secondary | ICD-10-CM | POA: Diagnosis not present

## 2019-06-23 DIAGNOSIS — E669 Obesity, unspecified: Secondary | ICD-10-CM | POA: Diagnosis not present

## 2019-06-23 DIAGNOSIS — D443 Neoplasm of uncertain behavior of pituitary gland: Secondary | ICD-10-CM | POA: Diagnosis not present

## 2019-07-14 DIAGNOSIS — Z13220 Encounter for screening for lipoid disorders: Secondary | ICD-10-CM | POA: Diagnosis not present

## 2019-07-14 DIAGNOSIS — Z130 Encounter for screening for diseases of the blood and blood-forming organs and certain disorders involving the immune mechanism: Secondary | ICD-10-CM | POA: Diagnosis not present

## 2019-07-14 DIAGNOSIS — Z13228 Encounter for screening for other metabolic disorders: Secondary | ICD-10-CM | POA: Diagnosis not present

## 2019-07-14 DIAGNOSIS — Z1231 Encounter for screening mammogram for malignant neoplasm of breast: Secondary | ICD-10-CM | POA: Diagnosis not present

## 2019-07-14 DIAGNOSIS — Z1329 Encounter for screening for other suspected endocrine disorder: Secondary | ICD-10-CM | POA: Diagnosis not present

## 2019-07-14 DIAGNOSIS — Z1321 Encounter for screening for nutritional disorder: Secondary | ICD-10-CM | POA: Diagnosis not present

## 2019-07-14 DIAGNOSIS — Z131 Encounter for screening for diabetes mellitus: Secondary | ICD-10-CM | POA: Diagnosis not present

## 2019-07-27 ENCOUNTER — Ambulatory Visit: Payer: 59 | Attending: Internal Medicine

## 2019-07-27 DIAGNOSIS — Z20822 Contact with and (suspected) exposure to covid-19: Secondary | ICD-10-CM

## 2019-07-28 LAB — SARS-COV-2, NAA 2 DAY TAT

## 2019-07-28 LAB — NOVEL CORONAVIRUS, NAA: SARS-CoV-2, NAA: NOT DETECTED

## 2019-08-26 ENCOUNTER — Other Ambulatory Visit: Payer: Self-pay

## 2019-08-26 ENCOUNTER — Telehealth: Payer: Self-pay | Admitting: Psychiatry

## 2019-08-26 DIAGNOSIS — F331 Major depressive disorder, recurrent, moderate: Secondary | ICD-10-CM

## 2019-08-26 DIAGNOSIS — F411 Generalized anxiety disorder: Secondary | ICD-10-CM

## 2019-08-26 MED ORDER — DULOXETINE HCL 30 MG PO CPEP: 30.0000 mg | ORAL_CAPSULE | Freq: Two times a day (BID) | ORAL | 0 refills | Status: DC

## 2019-08-26 NOTE — Telephone Encounter (Signed)
90 day Rx submitted to [facility]

## 2019-08-26 NOTE — Telephone Encounter (Signed)
Pt is requeting a rf of Cymbalta.but her insurance has changed. It will be updated in system. Please send in her refills on Cymbalta to [facility]. ID is [id number], grp # [id number] [facility] # [id number].

## 2019-08-29 IMAGING — MR MRI LUMBAR SPINE WITHOUT CONTRAST
4 of 5 series · 17 of 48 positions shown · non-contrast
Comparison: Prior radiograph from 06/09/2018.

CLINICAL DATA: Initial evaluation for left leg pain and tingling,
with tingling involving both legs below the knees.

EXAM:
MRI LUMBAR SPINE WITHOUT CONTRAST
TECHNIQUE: Multiplanar, multisequence MR imaging of the lumbar spine was
performed. No intravenous contrast was administered.

[Series 6: T2 · sagittal · 4.0mm · 0.78mm/px · 5 of 15 slices shown (1 of 2)]
[im 1/15]
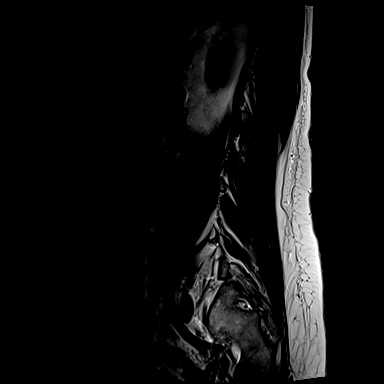
[im 4/15]
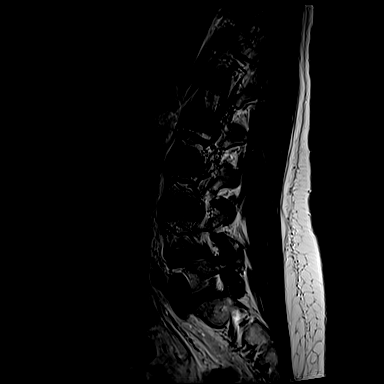
[im 8/15]
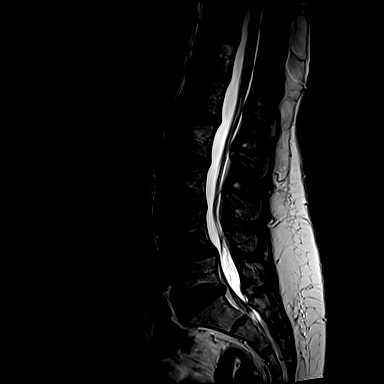
[im 11/15]
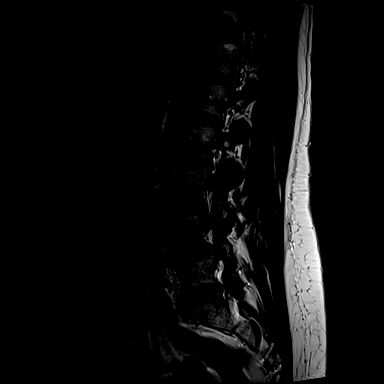
[im 15/15]
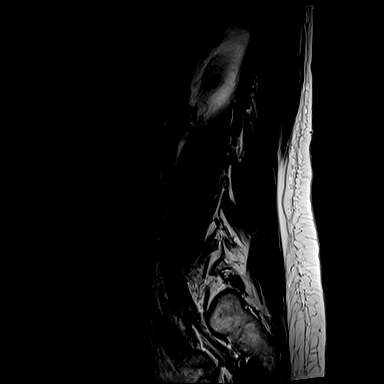

[Series 7: T1 · sagittal · 4.0mm · 0.78mm/px · 3 of 15 slices shown (1 of 2)]
[im 1/15]
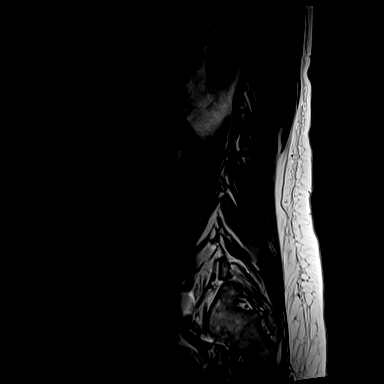
[im 8/15]
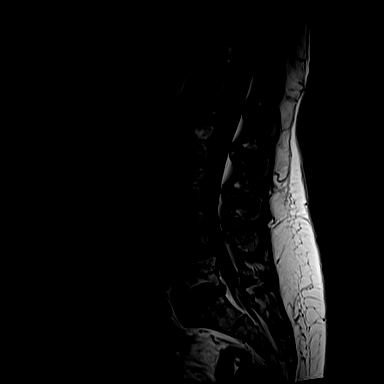
[im 15/15]
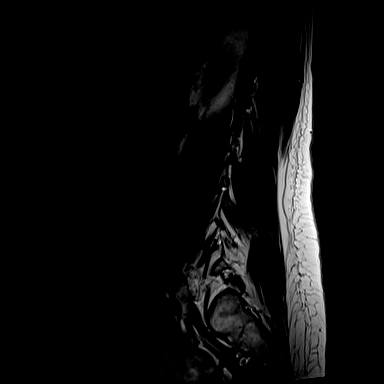

[Series 13: T2 · axial · 4.0mm · 0.28mm/px · z∈[-111,+83]mm · 6 of 42 slices shown (2 of 2)]
[im 3/42]
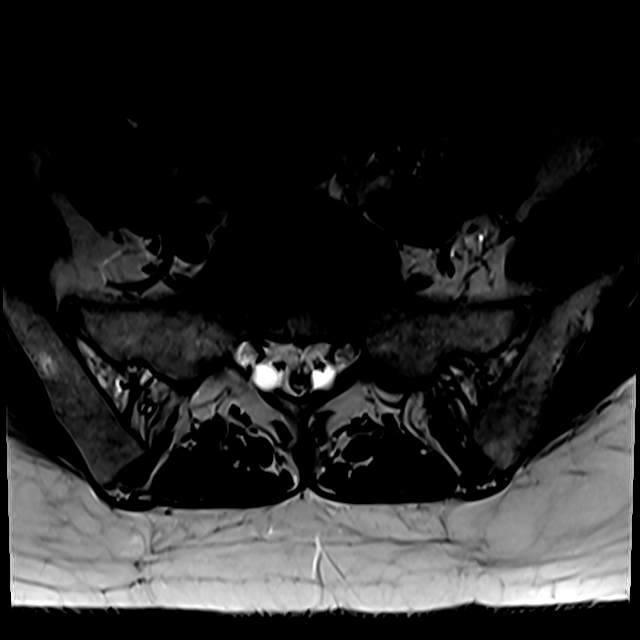
[im 6/42]
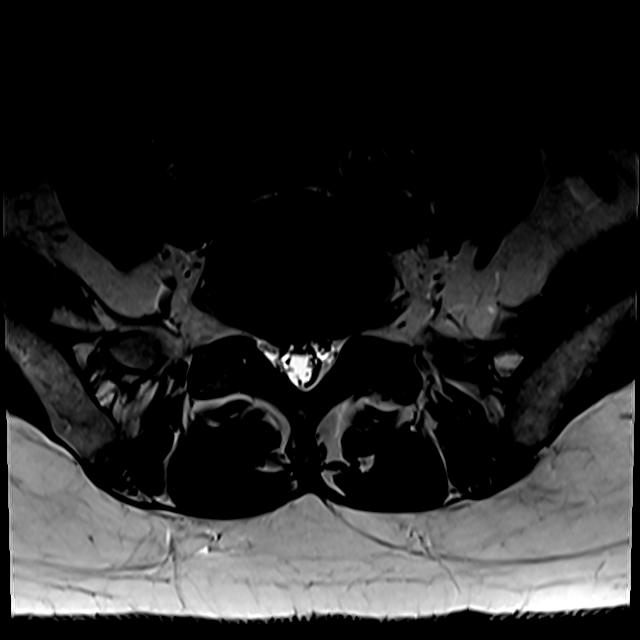
[im 9/42]
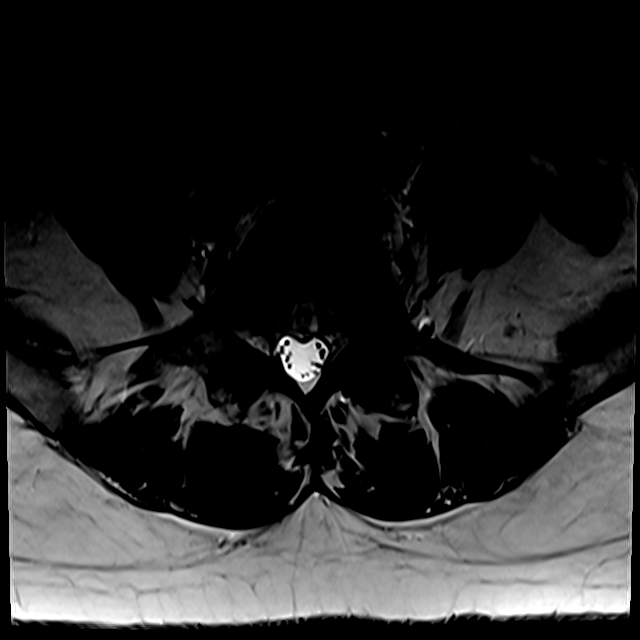
[im 14/42]
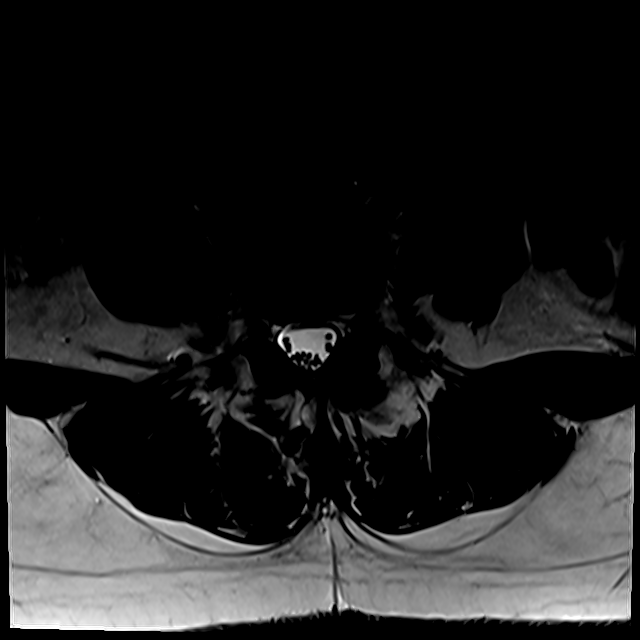
[im 22/42]
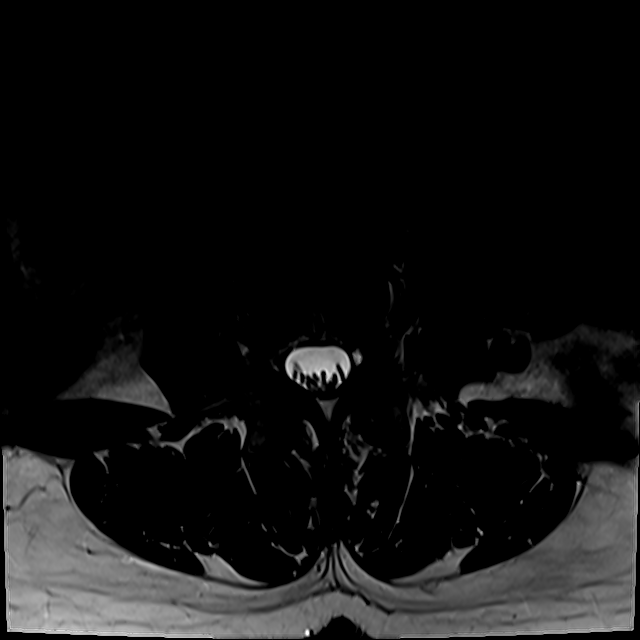
[im 36/42]
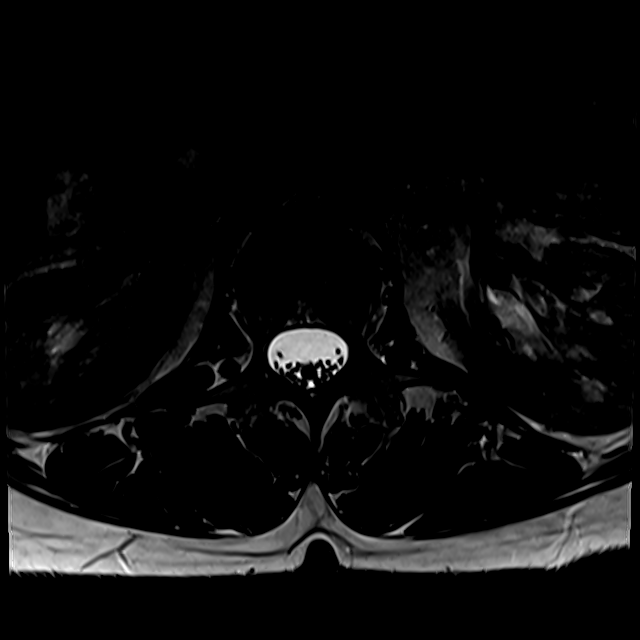

[Series 100: T1 · axial · 4.0mm · 0.28mm/px · z∈[-97,+83]mm · 3 of 42 slices shown (2 of 2)]
[im 6/42]
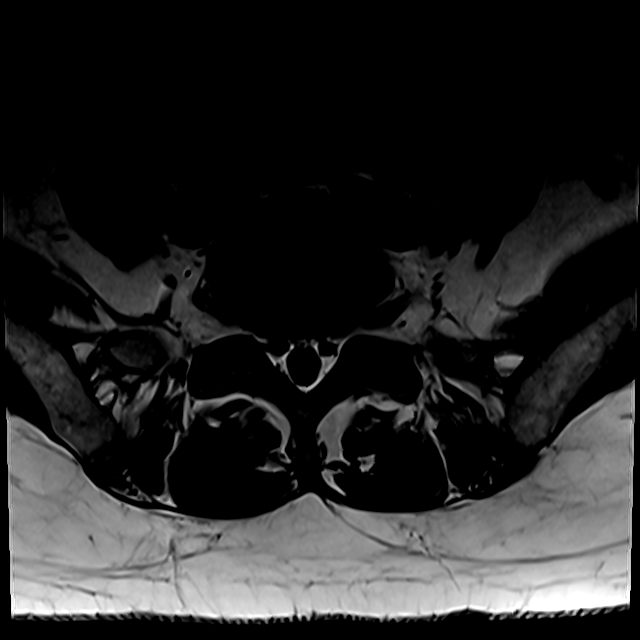
[im 22/42]
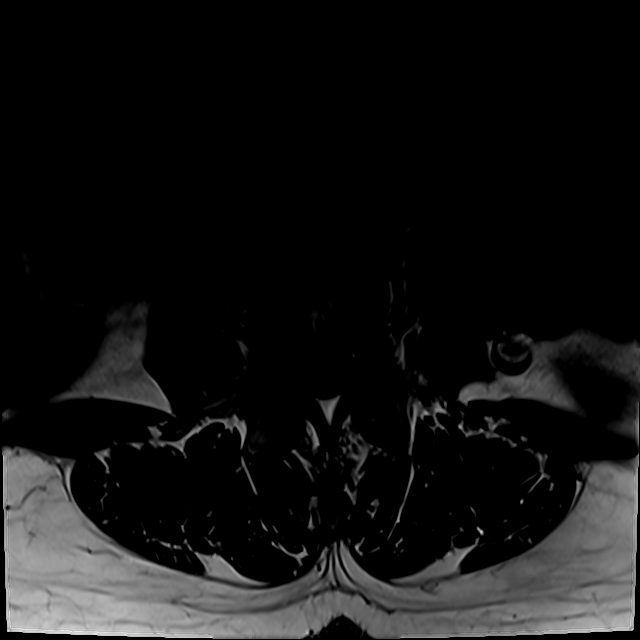
[im 36/42]
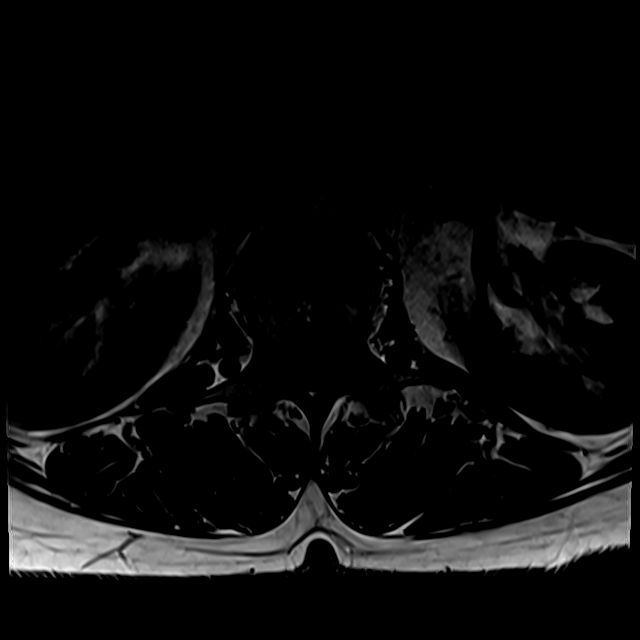

[17 of 48 positions shown; findings below may reference images not displayed]

FINDINGS: Segmentation: Standard. Lowest well-formed disc labeled the L5-S1
level.

Alignment: 4 mm anterolisthesis of L4 on L5, chronic and facet
mediated. Alignment otherwise normal with preservation of the normal
lumbar lordosis.

Vertebrae: Vertebral body heights maintained without evidence for
acute or chronic fracture. Bone marrow signal intensity within
normal limits. Few scattered benign hemangiomas noted. No worrisome
osseous lesions. No abnormal marrow edema.

Conus medullaris and cauda equina: Conus extends to the L1 level.
Conus and cauda equina appear normal.

Paraspinal and other soft tissues: Paraspinous soft tissues within
normal limits. Visualized visceral structures unremarkable.

Disc levels:

L1-2:  Minimal annular disc bulge.  No canal or foraminal stenosis.

L2-3: Negative interspace. Mild bilateral facet hypertrophy. No
significant canal or foraminal stenosis.

L3-4: Disc desiccation without significant disc bulge. Mild to
moderate facet and ligament flavum hypertrophy. Resultant mild left
lateral recess stenosis. Central canal remains patent. No
significant foraminal encroachment.

L4-5: 4 mm anterolisthesis. Associated broad posterior pseudo disc
bulge/uncovering. Associated annular fissure at the level of the
right neural foramen. Severe bilateral facet arthrosis, slightly
worse on the left. Resultant moderate to severe bilateral lateral
recess narrowing with mild narrowing of the central canal. Mild
bilateral L4 foraminal stenosis without frank impingement.

L5-S1: Disc desiccation with superimposed broad-based posterior disc
bulge. Bulging disc contacts the bilateral S1 nerve root sheaths
without frank neural impingement or displacement. Central canal
remains patent. No significant foraminal encroachment.
IMPRESSION: 1. Chronic 4 mm facet mediated anterolisthesis of L4 on L5 with
associated moderate to severe bilateral lateral recess stenosis,
descending L5 nerve root level.
2. Broad-based posterior disc bulge at L5-S1, contacting the
descending S1 nerve roots bilaterally without frank neural
impingement or displacement.
3. Mild to moderate left greater than right facet hypertrophy at
L3-4 with resultant mild left lateral recess stenosis.

## 2019-09-02 ENCOUNTER — Other Ambulatory Visit: Payer: Self-pay

## 2019-09-02 ENCOUNTER — Ambulatory Visit (INDEPENDENT_AMBULATORY_CARE_PROVIDER_SITE_OTHER): Payer: 59 | Admitting: Podiatrist

## 2019-09-02 ENCOUNTER — Encounter: Payer: Self-pay | Admitting: Podiatrist

## 2019-09-02 VITALS — Temp 96.2°F

## 2019-09-02 DIAGNOSIS — B351 Tinea unguium: Secondary | ICD-10-CM

## 2019-09-02 DIAGNOSIS — S90111A Contusion of right great toe without damage to nail, initial encounter: Secondary | ICD-10-CM

## 2019-09-02 NOTE — Patient Instructions (Signed)
I have ordered a medication for you that will come from [facility] in [place]. They should be calling you to verify insurance and will mail the medication to you. If you live close by then you can go by their pharmacy to pick up the medication. Their phone number is [phone]. If you do not hear from them in the next few days, please give us a call at [phone].   

## 2019-09-02 NOTE — Progress Notes (Signed)
  Chief Complaint  Patient presents with  . Nail Problem    R hallux - dark spot on nail. x3 weeks. Pt stated, "I noticed it when my nail polish was removed during a pedicure. No pain/bleeding/drainage. My foot was bruised a few weeks ago, but I don't know why. 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 I would prefer to focus on the nail for now".     HPI: Patient is 59 y.o. female who presents today for the concerns as listed above.  Patient relates she noticed a dark spot on her great toenail about 3 weeks ago and she wonders if it is a bruise or if it is fungus.   Review of Systems No fevers, chills, nausea, muscle aches, no difficulty breathing, no calf pain, no chest pain or shortness of breath.   Physical Exam  GENERAL APPEARANCE: Alert, conversant. Appropriately groomed. No acute distress.   VASCULAR: Pedal pulses palpable DP and PT bilateral.  Capillary refill time is immediate to all digits,  Proximal to distal cooling it warm to warm.  Digital hair growth is present bilateral   NEUROLOGIC: sensation is intact epicritically and protectively to 5.07 monofilament at 5/5 sites bilateral.  Light touch is intact bilateral, vibratory sensation intact bilateral, achilles tendon reflex is intact bilateral.   MUSCULOSKELETAL: acceptable muscle strength, tone and stability bilateral.  No gross boney pedal deformities noted.  No pain, crepitus or limitation noted with foot and ankle range of motion bilateral.   DERMATOLOGIC: skin is warm, supple, and dry.  No open lesions noted.  No rash, no pre ulcerative lesions. Digital right hallux nail distal half has a area of lifted nail that is yellow with subungual debris present.  Central area of purplish/ brown  Discoloration is noted. Concern over fungus versus contusion     Assessment   Contusion of toe versus onychomycosis  Plan  Nail sample to send to [facility]-to evaluate for fungus.  Going to go ahead and start her on a topical  antifungal medication from [employer].  We will call with the result of the nail culture and decide on treatment.

## 2019-09-03 ENCOUNTER — Telehealth: Payer: Self-pay | Admitting: Adult Health

## 2019-09-03 NOTE — Telephone Encounter (Signed)
Reviewed with submit PA with her new insurance information.

## 2019-09-03 NOTE — Telephone Encounter (Signed)
Pt said that she will need a PA done on Cymbalta because the way the rx was sent in at [facility].  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 She gave Eustaquio Maize all her insurance info.

## 2019-09-03 NOTE — Telephone Encounter (Signed)
Prior authorization came back with an approval for Duloxetine 30 mg #180 effective 09/03/2019-09/02/2020 PA# [id number]   Patient is notified as well.

## 2019-09-03 NOTE — Telephone Encounter (Signed)
Cover my meds had trouble locating patient, had to contact [facility] they instructed to add #06 on end of her ID# although she is listed as dependent #01. Patient's ID# is [id number] for future Pa's.  PA has been submitted for Duloxetine 30 mg bid #180 Pending response

## 2019-09-05 LAB — WOUND CULTURE
MICRO NUMBER:: 10469374
SPECIMEN QUALITY:: ADEQUATE

## 2019-09-07 ENCOUNTER — Telehealth: Payer: Self-pay | Admitting: *Deleted

## 2019-09-07 NOTE — Telephone Encounter (Signed)
[facility] is not billing patient for the lab so I would say no being we have no control over what the lab charges out

## 2019-09-07 NOTE — Telephone Encounter (Signed)
I don't know if we can do anything to fix this or not... this was supposed to be sent for a fungul culture to look for nail fungus- but... it was sent for a wound culture. Is there any way they can test it again for a fungal culture with the sample they have? Otherwise i'll need to get her back in to get another sample?

## 2019-09-08 NOTE — Telephone Encounter (Signed)
I spoke with [facility] - Marjory Lies (9:05am) bill is not generate contact account/sales representative for how to take care of, and transferred to Client Services [phone] to get representative name, Boyce states our sales representative is R. Barnes. I was unable to speak with Debbora Lacrosse, her phone requested a remote access code. I called Client Services - Langley Gauss, informed of the phone message on R. Barnes phone and no answer service. Client Services - Langley Gauss states she will contact the phone number and put me on hold. Langley Gauss received the same phone message. Langley Gauss offered [employer] email and that for my purpose is should wait until Friday when the bills are generated and inform R. Drema [place] of the problem and she would be able to look up the pt's bill and determine what is to be done.

## 2019-09-22 NOTE — Telephone Encounter (Signed)
-----   Message from Bronson Ing, DPM sent at 09/17/2019 10:05 AM EDT ----- Regarding: NAIL SAMPLE Mix up OK-  Would you or Mateo Flow- (copied on this) mind calling and asking her to bring in another piece of nail in a sealed zip lock bag- or, she could come in and maybe someone could grab a quick sample of the nail (right first I believe).  She is super nice.  Just let her know there was a mix up at the lab and we need to get another sample to get an accurate result.   Thanks guys!!    ----- Message ----- From: Viviana Simpler, PMAC Sent: 09/16/2019   2:16 PM EDT To: Bronson Ing, DPM  Hey Dr Valentina Lucks, the representative from [facility] called and stated that the bad that the nail culture was in was not sealed and they only received the paperwork and stated that they would hold on to the paperwork and for [place] to get another sample and send it. Lattie Haw

## 2019-09-22 NOTE — Telephone Encounter (Signed)
Left message for pt to call concerning the bill and her specimen.

## 2019-09-23 ENCOUNTER — Telehealth: Payer: Self-pay | Admitting: *Deleted

## 2019-09-23 DIAGNOSIS — B351 Tinea unguium: Secondary | ICD-10-CM

## 2019-09-23 NOTE — Telephone Encounter (Signed)
I called patient this morning and stated that the [facility] lab mixed up the test up and we need to get another nail sample and [place] and stated that she was not happy about that and would do her best to get a nail sample. [name]

## 2019-09-24 NOTE — Addendum Note (Signed)
Addended by: Celene Skeen A on: 09/24/2019 11:34 AM   Modules accepted: Orders

## 2019-09-24 NOTE — Addendum Note (Signed)
Addended by: Celene Skeen A on: 09/24/2019 11:15 AM   Modules accepted: Orders

## 2019-10-01 ENCOUNTER — Ambulatory Visit: Payer: 59 | Admitting: Adult Health

## 2019-10-05 ENCOUNTER — Other Ambulatory Visit: Payer: Self-pay | Admitting: Adult Health

## 2019-10-05 ENCOUNTER — Telehealth (INDEPENDENT_AMBULATORY_CARE_PROVIDER_SITE_OTHER): Payer: 59 | Admitting: Adult Health

## 2019-10-05 ENCOUNTER — Encounter: Payer: Self-pay | Admitting: Adult Health

## 2019-10-05 DIAGNOSIS — G4700 Insomnia, unspecified: Secondary | ICD-10-CM

## 2019-10-05 DIAGNOSIS — F331 Major depressive disorder, recurrent, moderate: Secondary | ICD-10-CM | POA: Diagnosis not present

## 2019-10-05 DIAGNOSIS — F411 Generalized anxiety disorder: Secondary | ICD-10-CM

## 2019-10-05 MED ORDER — TRAZODONE HCL 50 MG PO TABS: ORAL_TABLET | ORAL | 3 refills | Status: DC

## 2019-10-05 MED ORDER — DULOXETINE HCL 30 MG PO CPEP: 30.0000 mg | ORAL_CAPSULE | Freq: Two times a day (BID) | ORAL | 3 refills | Status: DC

## 2019-10-05 MED FILL — traZODone HCL 50 MG TABS: 50 | 90 days supply | Qty: 270 | Fill #0

## 2019-10-05 NOTE — Progress Notes (Signed)
[name] [id number] [birth year] 59 y.o.  Virtual Visit via Video Note  I connected with pt @ on 10/05/19 at  3:20 PM EDT by a video enabled telemedicine application and verified that I am speaking with the correct person using two identifiers.   I discussed the limitations of evaluation and management by telemedicine and the availability of in person appointments. The patient expressed understanding and agreed to proceed.  I discussed the assessment and treatment plan with the patient. The patient was provided an opportunity to ask questions and all were answered. The patient agreed with the plan and demonstrated an understanding of the instructions.   The patient was advised to call back or seek an in-person evaluation if the symptoms worsen or if the condition fails to improve as anticipated.  I provided 15 minutes of non-face-to-face time during this encounter.  The patient was located at home.  The provider was located at [place].   Aloha Gell, NP   Subjective:   Patient ID:  [name] is a 58 y.o. (DOB [birth year]) female.  Chief Complaint: No chief complaint on file.   HPI [name] presents for follow-up of depression, anxiety, and insomnia.   Describes mood today as "ok". Pleasant. Mood symptoms - denies depression, anxiety, and irritability. Stating "I'm doing really well". Recently married. Sold her house and has moved in with husband. Daughter doing well. Feels like medications continue to work well. Stable interest and motivation.  Energy levels stable. Active, but does not have a regular exercise routine. Works full time. Enjoys some usual interests and activities. Married. Lives with husband and daughter. Spending time with family.  Talking with friends and family.  Appetite adequate. Weight stable. Sleeps well most nights. Averages 6 - 8 hours. Focus and concentration stable. Completing tasks. Managing aspects of household.  Work going well. Denies SI or HI. Denies AH or VH.  Review of Systems:  Review of Systems  Musculoskeletal: Negative for gait problem.  Neurological: Negative for tremors.  Psychiatric/Behavioral:       Please refer to HPI    Medications: I have reviewed the patient's current medications.  Current Outpatient Medications  Medication Sig Dispense Refill  . aspirin EC 81 MG tablet Take 1 tablet (81 mg total) by mouth daily. 90 tablet 3  . DULoxetine (CYMBALTA) 30 MG capsule Take 1 capsule (30 mg total) by mouth 2 (two) times daily. 180 capsule 3  . mesalamine (LIALDA) 1.2 g EC tablet Take 2 tablets by mouth daily with breakfast.    . Probiotic Product (PROBIOTIC DAILY PO) Take 1 tablet by mouth daily.    Marland Kitchen terconazole (TERAZOL 3) 0.8 % vaginal cream Place 1 applicator vaginally as needed.    . traZODone (DESYREL) 50 MG tablet Take one to three tablets at bedtime. 270 tablet 3   No current facility-administered medications for this visit.    Medication Side Effects: None  Allergies: No Known Allergies  No past medical history on file.  Family History  Problem Relation Age of Onset  . Goiter Mother   . Irregular heart beat Mother        Afibrillation  . Heart attack Mother   . Heart failure Father   . Thyroid disease Father   . Irregular heart beat Father        Afibrillation  . Heart disease Father   . Diabetes Brother     Social History   Socioeconomic History  . Marital status: Married  Spouse name: Not on file  . Number of children: Not on file  . Years of education: Not on file  . Highest education level: Not on file  Occupational History  . Not on file  Tobacco Use  . Smoking status: Never Smoker  . Smokeless tobacco: Never Used  Substance and Sexual Activity  . Alcohol use: Yes  . Drug use: No  . Sexual activity: Yes    Birth control/protection: Other-see comments    Comment: endometrial ablation  Other Topics Concern  . Not on file  Social  History Narrative  . Not on file   Social Determinants of Health   Financial Resource Strain:   . Difficulty of Paying Living Expenses:   Food Insecurity:   . Worried About Charity fundraiser in the Last Year:   . Arboriculturist in the Last Year:   Transportation Needs:   . Film/video editor (Medical):   Marland Kitchen Lack of Transportation (Non-Medical):   Physical Activity:   . Days of Exercise per Week:   . Minutes of Exercise per Session:   Stress:   . Feeling of Stress :   Social Connections:   . Frequency of Communication with Friends and Family:   . Frequency of Social Gatherings with Friends and Family:   . Attends Religious Services:   . Active Member of Clubs or Organizations:   . Attends Archivist Meetings:   Marland Kitchen Marital Status:   Intimate Partner Violence:   . Fear of Current or Ex-Partner:   . Emotionally Abused:   Marland Kitchen Physically Abused:   . Sexually Abused:     Past Medical History, Surgical history, Social history, and Family history were reviewed and updated as appropriate.   Please see review of systems for further details on the patient's review from today.   Objective:   Physical Exam:  There were no vitals taken for this visit.  Physical Exam Constitutional:      General: She is not in acute distress. Musculoskeletal:        General: No deformity.  Neurological:     Mental Status: She is alert and oriented to person, place, and time.     Coordination: Coordination normal.  Psychiatric:        Attention and Perception: Attention and perception normal. She does not perceive auditory or visual hallucinations.        Mood and Affect: Mood normal. Mood is not anxious or depressed. Affect is not labile, blunt, angry or inappropriate.        Speech: Speech normal.        Behavior: Behavior normal.        Thought Content: Thought content normal. Thought content is not paranoid or delusional. Thought content does not include homicidal or suicidal  ideation. Thought content does not include homicidal or suicidal plan.        Cognition and Memory: Cognition and memory normal.        Judgment: Judgment normal.     Comments: Insight intact     Lab Review:     Component Value Date/Time   NA 140 06/26/2018 1115   K 4.3 06/26/2018 1115   CL 102 06/26/2018 1115   CO2 27 06/26/2018 1115   GLUCOSE 99 06/26/2018 1115   GLUCOSE 98 03/26/2018 1806   BUN 18 06/26/2018 1115   CREATININE 0.85 06/26/2018 1115   CALCIUM 9.7 06/26/2018 1115   CALCIUM 9.5 05/01/2016 0000   PROT 6.7  06/26/2018 1115   ALBUMIN 4.4 06/26/2018 1115   AST 18 06/26/2018 1115   ALT 22 06/26/2018 1115   ALKPHOS 66 06/26/2018 1115   BILITOT 0.3 06/26/2018 1115   GFRNONAA 77 06/26/2018 1115   GFRAA 89 06/26/2018 1115       Component Value Date/Time   WBC 7.4 03/26/2018 1806   RBC 4.23 03/26/2018 1806   HGB 11.9 (L) 03/26/2018 1806   HCT 38.1 03/26/2018 1806   PLT 224 03/26/2018 1806   MCV 90.1 03/26/2018 1806   MCH 28.1 03/26/2018 1806   MCHC 31.2 03/26/2018 1806   RDW 13.2 03/26/2018 1806   LYMPHSABS 1.8 03/26/2018 1806   MONOABS 0.6 03/26/2018 1806   EOSABS 0.1 03/26/2018 1806   BASOSABS 0.0 03/26/2018 1806    No results found for: POCLITH, LITHIUM   No results found for: PHENYTOIN, PHENOBARB, VALPROATE, CBMZ   .res Assessment: Plan:    Plan:  Cymbalta 60mg  daily. Trazadone 50mg  - 1 to 3 tabs at hs  RTC 6 months  Patient advised to contact office with any questions, adverse effects, or acute worsening in signs and symptoms.   Diagnoses and all orders for this visit:  Major depressive disorder, recurrent episode, moderate (HCC) -     traZODone (DESYREL) 50 MG tablet; Take one to three tablets at bedtime. -     DULoxetine (CYMBALTA) 30 MG capsule; Take 1 capsule (30 mg total) by mouth 2 (two) times daily.  Generalized anxiety disorder -     DULoxetine (CYMBALTA) 30 MG capsule; Take 1 capsule (30 mg total) by mouth 2 (two) times  daily.  Insomnia, unspecified type -     traZODone (DESYREL) 50 MG tablet; Take one to three tablets at bedtime.     Please see After Visit Summary for patient specific instructions.  Future Appointments  Date Time Provider [place]  10/08/2019  2:15 PM Dillingham, Loel Lofty, DO [facility] None    No orders of the defined types were placed in this encounter.     -------------------------------

## 2019-10-05 NOTE — Telephone Encounter (Signed)
Ms. [name] are scheduled for a virtual visit with your provider today.    Just as we do with appointments in the office, we must obtain your consent to participate.  Your consent will be active for this visit and any virtual visit you may have with one of our providers in the next 365 days.    If you have a MyChart account, I can also send a copy of this consent to you electronically.  All virtual visits are billed to your insurance company just like a traditional visit in the office.  As this is a virtual visit, video technology does not allow for your provider to perform a traditional examination.  This may limit your provider's ability to fully assess your condition.  If your provider identifies any concerns that need to be evaluated in person or the need to arrange testing such as labs, EKG, etc, we will make arrangements to do so.    Although advances in technology are sophisticated, we cannot ensure that it will always work on either your end or our end.  If the connection with a video visit is poor, we may have to switch to a telephone visit.  With either a video or telephone visit, we are not always able to ensure that we have a secure connection.   I need to obtain your verbal consent now.   Are you willing to proceed with your visit today?   [name] has provided verbal consent on 10/05/2019 for a virtual visit (video or telephone).   Aloha Gell, NP 10/05/2019  3:25 PM

## 2019-10-08 ENCOUNTER — Encounter: Payer: Self-pay | Admitting: Plastic Surgery

## 2019-10-08 ENCOUNTER — Other Ambulatory Visit: Payer: Self-pay

## 2019-10-08 ENCOUNTER — Ambulatory Visit (INDEPENDENT_AMBULATORY_CARE_PROVIDER_SITE_OTHER): Payer: 59 | Admitting: Plastic Surgery

## 2019-10-08 DIAGNOSIS — L989 Disorder of the skin and subcutaneous tissue, unspecified: Secondary | ICD-10-CM | POA: Insufficient documentation

## 2019-10-08 NOTE — Progress Notes (Signed)
Patient ID: [name], female    DOB: 08-05-1960, 59 y.o.   MRN: [id number]   Chief Complaint  Patient presents with  . Skin Problem    The patient is a 59 year old female here for evaluation of her skin.  The patient has a 1.5 cm on her forehead.  It is to the hairline.  It is a little bit irregular, flesh-colored and raised.  Scaly and is getting rapidly larger.  She has a changing skin lesion on her right inner thigh that is pedunculated 4 mm in size.  It is flesh-colored.  It is also getting larger.  She has 2 scars on her face from previous shave biopsies that she does not like.  She would like to avoid this kind of scarring in the future if possible they are hyperpigmented areas.   Review of Systems  Constitutional: Negative.   HENT: Negative.   Eyes: Negative.   Respiratory: Negative.   Cardiovascular: Negative.   Gastrointestinal: Negative.   Endocrine: Negative.   Genitourinary: Negative.   Musculoskeletal: Negative.   Psychiatric/Behavioral: Negative.     History reviewed. No pertinent past medical history.  Past Surgical History:  Procedure Laterality Date  . APPENDECTOMY    . CHOLECYSTECTOMY    . ENDOMETRIAL ABLATION    . LAPAROTOMY    . laproscopic kne    . TONSILLECTOMY        Current Outpatient Medications:  .  aspirin EC 81 MG tablet, Take 1 tablet (81 mg total) by mouth daily., Disp: 90 tablet, Rfl: 3 .  DULoxetine (CYMBALTA) 30 MG capsule, Take 1 capsule (30 mg total) by mouth 2 (two) times daily., Disp: 180 capsule, Rfl: 3 .  mesalamine (LIALDA) 1.2 g EC tablet, Take 2 tablets by mouth daily with breakfast., Disp: , Rfl:  .  Probiotic Product (PROBIOTIC DAILY PO), Take 1 tablet by mouth daily., Disp: , Rfl:  .  terconazole (TERAZOL 3) 0.8 % vaginal cream, Place 1 applicator vaginally as needed., Disp: , Rfl:  .  traZODone (DESYREL) 50 MG tablet, Take one to three tablets at bedtime., Disp: 270 tablet, Rfl: 3   Objective:   Vitals:    10/08/19 1419  BP: 105/70  Pulse: 80  Temp: (!) 97.3 F (36.3 C)  SpO2: 99%    Physical Exam Vitals and nursing note reviewed.  Constitutional:      Appearance: Normal appearance.  HENT:     Head: Normocephalic and atraumatic.   Cardiovascular:     Rate and Rhythm: Normal rate.     Pulses: Normal pulses.  Pulmonary:     Effort: Pulmonary effort is normal.  Abdominal:     General: There is no distension.  Musculoskeletal:       Legs:  Neurological:     General: No focal deficit present.     Mental Status: She is alert and oriented to person, place, and time.  Psychiatric:        Mood and Affect: Mood normal.        Behavior: Behavior normal.        Thought Content: Thought content normal.     Assessment & Plan:  Changing skin lesion  Recommend excision of forehead changing skin lesion and right inner thigh changing skin lesion.  She would be a good candidate for IPL laser for her face and perhaps dermabrasion.  Pictures were obtained of the patient and placed in the chart with the patient's or guardian's permission.  [place], DO

## 2019-10-13 ENCOUNTER — Telehealth: Payer: Self-pay | Admitting: *Deleted

## 2019-10-13 NOTE — Telephone Encounter (Signed)
I spoke pt and her lab was positive for fungus and Dr. Valentina Lucks stated due to the error on our office part, the laser treatments would be complimentary, and she should speak to someone to schedule. I told pt I would have a scheduler contact her with the laser schedule and Caryl Pina, CMA that performed the laser would discuss the prognosis and how it would appear. Pt asked if the toenails would be painful and I told her generally no.

## 2019-10-19 ENCOUNTER — Telehealth: Payer: Self-pay | Admitting: *Deleted

## 2019-10-19 MED ORDER — TERBINAFINE HCL 250 MG PO TABS: 250.0000 mg | ORAL_TABLET | Freq: Every day | ORAL | 0 refills | Status: DC

## 2019-10-19 MED FILL — TERBINAFINE HCL 250 MG TAB: 250 | 30 days supply | Qty: 30 | Fill #0

## 2019-10-19 NOTE — Telephone Encounter (Signed)
Patient is scheduled for laser treatments starting 11-27-19. Dr. Valentina Lucks would like to start patient on lamisil as well. Since her treatment is a month away, I called patient to let her know I would send in her first 30 days of lamisil to the pharmacy and when she is here for her first laser, I would give her a lab req at that time for bloodwork.   Lamisil 250mg  #30 sent to [facility].

## 2019-11-03 ENCOUNTER — Other Ambulatory Visit (HOSPITAL_COMMUNITY)
Admission: RE | Admit: 2019-11-03 | Discharge: 2019-11-03 | Disposition: A | Discharge status: Home / Self Care | Payer: 59 | Source: Ambulatory Visit | Attending: Plastic Surgery | Admitting: Plastic Surgery

## 2019-11-03 ENCOUNTER — Other Ambulatory Visit: Payer: Self-pay

## 2019-11-03 ENCOUNTER — Ambulatory Visit (INDEPENDENT_AMBULATORY_CARE_PROVIDER_SITE_OTHER): Payer: 59 | Admitting: Plastic Surgery

## 2019-11-03 ENCOUNTER — Encounter: Payer: Self-pay | Admitting: Plastic Surgery

## 2019-11-03 VITALS — BP 105/68 | HR 90 | Temp 98.0°F

## 2019-11-03 DIAGNOSIS — L989 Disorder of the skin and subcutaneous tissue, unspecified: Secondary | ICD-10-CM | POA: Insufficient documentation

## 2019-11-03 DIAGNOSIS — R0789 Other chest pain: Secondary | ICD-10-CM

## 2019-11-03 NOTE — Addendum Note (Signed)
Addended by: Wallace Going on: 11/03/2019 04:20 PM   Modules accepted: Orders

## 2019-11-03 NOTE — Progress Notes (Signed)
Procedure Note  Preoperative Dx: changing skin lesion of forehead and right thigh  Postoperative Dx: Same  Procedure: Excision of changing skin lesion of forehead 1 x 1 cm and right thigh 3 mm  Anesthesia: Lidocaine 1% with 1:100,000 epinepherine   Description of Procedure: Risks and complications were explained to the patient.  Consent was confirmed and the patient understands the risks and benefits.  The potential complications and alternatives were explained and the patient consents.  The patient expressed understanding the option of not having the procedure and the risks of a scar.  Time out was called and all information was confirmed to be correct.    Forehead: The area was prepped and drapped.  Lidocaine 1% with epinepherine was injected in the subcutaneous area.  After waiting several minutes for the local to take affect a #15 blade was used to excise the area in an eliptical pattern.  A 5-0 Monocryl was used to close the skin edges.  A dressing was applied.  The specimens were sent to pathology.   Leg:  The area was prepped and drapped.  Lidocaine 1% with epinepherine was injected in the subcutaneous area.  After waiting several minutes for the local to take affect a #15 blade was used to excise the area in an eliptical pattern.  A 5-0 Monocryl was used to close the skin edges.  A dressing was applied.  The patient was given instructions on how to care for the area and a follow up appointment.  Thyra tolerated the procedure well and there were no complications.  The patient agreed to not send the leg lesion as it did not look worrisome.

## 2019-11-05 LAB — SURGICAL PATHOLOGY

## 2019-11-13 ENCOUNTER — Other Ambulatory Visit: Payer: Self-pay

## 2019-11-13 ENCOUNTER — Ambulatory Visit (INDEPENDENT_AMBULATORY_CARE_PROVIDER_SITE_OTHER): Payer: 59 | Admitting: Plastic Surgery

## 2019-11-13 ENCOUNTER — Encounter: Payer: Self-pay | Admitting: Plastic Surgery

## 2019-11-13 VITALS — BP 103/74 | HR 80 | Temp 98.2°F

## 2019-11-13 DIAGNOSIS — L989 Disorder of the skin and subcutaneous tissue, unspecified: Secondary | ICD-10-CM

## 2019-11-13 NOTE — Progress Notes (Signed)
The patient is a 59 year old female here for follow-up after undergoing excision of 2 changing skin lesions.  The one on her right thigh is completely healed.  The forehead was a seborrheic keratosis.  It is healing well.  No sign of infection.  I removed the sutures today and placed a Steri-Strip.  Recommend that she keep the Steri-Strip on for about a week and then she can start using scar cream.  Follow-up as needed.

## 2019-11-27 ENCOUNTER — Other Ambulatory Visit: Payer: Self-pay

## 2019-11-30 ENCOUNTER — Other Ambulatory Visit: Payer: Self-pay

## 2019-11-30 ENCOUNTER — Ambulatory Visit (INDEPENDENT_AMBULATORY_CARE_PROVIDER_SITE_OTHER): Payer: 59 | Admitting: *Deleted

## 2019-11-30 DIAGNOSIS — B351 Tinea unguium: Secondary | ICD-10-CM

## 2019-11-30 DIAGNOSIS — Z79899 Other long term (current) drug therapy: Secondary | ICD-10-CM

## 2019-11-30 NOTE — Progress Notes (Signed)
Patient presents today for the 1st laser treatment. Diagnosed with mycotic nail infection by Dr. Valentina Lucks.   Toenail most affected are the hallux nails bilateral (R>L)  All other systems are negative.  Nails were filed thin. Laser therapy was administered to 1st toenails bilateral and patient tolerated the treatment well. All safety precautions were in place.   Patient is also taking terbinafine and has taken 30 days so far.  Follow up in 4 weeks for laser # 2.  Patient was given a lab requisition today to check liver enzymes.  Picture of nails taken today to document visual progress

## 2019-11-30 NOTE — Patient Instructions (Signed)

## 2019-12-03 LAB — HEPATIC FUNCTION PANEL
AG Ratio: 2.2 (calc) (ref 1.0–2.5)
ALT: 15 U/L (ref 6–29)
AST: 17 U/L (ref 10–35)
Albumin: 4.4 g/dL (ref 3.6–5.1)
Alkaline phosphatase (APISO): 63 U/L (ref 37–153)
Bilirubin, Direct: 0.1 mg/dL (ref 0.0–0.2)
Globulin: 2 g/dL (calc) (ref 1.9–3.7)
Indirect Bilirubin: 0.4 mg/dL (calc) (ref 0.2–1.2)
Total Bilirubin: 0.5 mg/dL (ref 0.2–1.2)
Total Protein: 6.4 g/dL (ref 6.1–8.1)

## 2019-12-15 ENCOUNTER — Other Ambulatory Visit: Payer: Self-pay | Admitting: Podiatrist

## 2019-12-15 NOTE — Telephone Encounter (Signed)
Pt is requesting a refill. Would you like for the pt to have a refill of Lamisil?

## 2019-12-16 ENCOUNTER — Other Ambulatory Visit (INDEPENDENT_AMBULATORY_CARE_PROVIDER_SITE_OTHER): Payer: 59 | Admitting: Podiatrist

## 2019-12-16 MED ORDER — TERBINAFINE HCL 250 MG PO TABS: 250.0000 mg | ORAL_TABLET | Freq: Every day | ORAL | 0 refills | Status: DC

## 2019-12-16 MED FILL — TERBINAFINE HCL 250 MG TAB: 250 | 60 days supply | Qty: 60 | Fill #0

## 2019-12-16 MED FILL — DULoxetine HCL 30 MG CPEP: 30 | 90 days supply | Qty: 180 | Fill #0

## 2019-12-16 NOTE — Progress Notes (Signed)
Called in remainder of Lamisil for treatment of onychomycosis- for a total of 90 days.

## 2019-12-23 MED FILL — MAGIC MOUTHWASH D/M/L: 2 days supply | Qty: 120 | Fill #0

## 2019-12-23 MED FILL — FLUCONAZOLE 100 MG TAB: 100 | 7 days supply | Qty: 8 | Fill #0

## 2019-12-24 MED FILL — TRIAMCINOLONE ACETONIDE 0.1: 0.1 | 10 days supply | Qty: 5 | Fill #0

## 2019-12-24 MED FILL — LIDOCAINE VISCOUS HCL 2 % S: 2 | 1 days supply | Qty: 100 | Fill #0

## 2020-01-01 ENCOUNTER — Other Ambulatory Visit: Payer: Self-pay

## 2020-01-01 ENCOUNTER — Ambulatory Visit (INDEPENDENT_AMBULATORY_CARE_PROVIDER_SITE_OTHER): Payer: 59 | Admitting: *Deleted

## 2020-01-01 DIAGNOSIS — B351 Tinea unguium: Secondary | ICD-10-CM

## 2020-01-01 NOTE — Progress Notes (Signed)
Patient presents today for the 2nd laser treatment. Diagnosed with mycotic nail infection by Dr. Valentina Lucks.   Toenail most affected are the hallux nails bilateral (R>L). There is some new growth showing on the left hallux.  All other systems are negative.  Nails were filed thin. Laser therapy was administered to 1st toenails bilateral and patient tolerated the treatment well. All safety precautions were in place.   Dr. Valentina Lucks refilled her terbinafine for another 60 days on 12/16/19.  Follow up in 4 weeks for laser # 3.

## 2020-01-08 MED FILL — CONTRAVE ER 8-90 MG TABLET: 8-90 | 90 days supply | Qty: 360 | Fill #0

## 2020-01-13 DIAGNOSIS — I77810 Thoracic aortic ectasia: Secondary | ICD-10-CM | POA: Insufficient documentation

## 2020-01-29 ENCOUNTER — Ambulatory Visit (INDEPENDENT_AMBULATORY_CARE_PROVIDER_SITE_OTHER): Payer: 59 | Admitting: *Deleted

## 2020-01-29 ENCOUNTER — Other Ambulatory Visit: Payer: Self-pay

## 2020-01-29 DIAGNOSIS — B351 Tinea unguium: Secondary | ICD-10-CM

## 2020-01-29 NOTE — Progress Notes (Signed)
Patient presents today for the 3rd laser treatment. Diagnosed with mycotic nail infection by Dr. Valentina Lucks.   Toenail most affected are the hallux nails bilateral (R>L). There is some new growth showing on the left hallux.  All other systems are negative.  Nails were filed thin. Laser therapy was administered to 1st toenails bilateral and patient tolerated the treatment well. All safety precautions were in place.   She has about 2 more weeks of terbinafine before she completes her 90 days.  Follow up in 6 weeks for laser # 4.

## 2020-02-11 ENCOUNTER — Encounter: Payer: Self-pay | Admitting: Psychiatry

## 2020-02-18 MED FILL — traZODone HCL 50 MG TABS: 50 | 90 days supply | Qty: 270 | Fill #1

## 2020-03-21 ENCOUNTER — Other Ambulatory Visit: Payer: Self-pay

## 2020-03-21 ENCOUNTER — Ambulatory Visit (INDEPENDENT_AMBULATORY_CARE_PROVIDER_SITE_OTHER): Payer: 59 | Admitting: *Deleted

## 2020-03-21 DIAGNOSIS — B351 Tinea unguium: Secondary | ICD-10-CM

## 2020-03-21 NOTE — Progress Notes (Signed)
Patient presents today for the 4th laser treatment. Diagnosed with mycotic nail infection by Dr. Valentina Lucks.   Toenail most affected are the hallux nails bilateral (R>L). There is quite a bit of new growth showing on the left hallux.  All other systems are negative.  All nails were cut and filed thin. Laser therapy was administered to 1st toenails bilateral and patient tolerated the treatment well. All safety precautions were in place.   She has completed 90 days of oral terbinafine.  Follow up in 6 weeks for laser # 5.

## 2020-04-01 MED FILL — DULoxetine HCL 30 MG CPEP: 30 | 90 days supply | Qty: 180 | Fill #1

## 2020-05-03 ENCOUNTER — Ambulatory Visit: Payer: 59 | Attending: Internal Medicine

## 2020-05-03 DIAGNOSIS — Z23 Encounter for immunization: Secondary | ICD-10-CM

## 2020-05-03 NOTE — Progress Notes (Signed)
   Covid-19 Vaccination Clinic  Name:  ASIANAE MINKLER    MRN: [id number] DOB: [birth year]  05/03/2020  Ms. Blatt was observed post Covid-19 immunization for 15 minutes without incident. She was provided with Vaccine Information Sheet and instruction to access the V-Safe system.   Ms. Belva Bertin was instructed to call 911 with any severe reactions post vaccine: Marland Kitchen Difficulty breathing  . Swelling of face and throat  . A fast heartbeat  . A bad rash all over body  . Dizziness and weakness   Immunizations Administered    Name Date Dose VIS Date Route   Pfizer COVID-19 Vaccine 05/03/2020  3:22 PM 0.3 mL 02/10/2020 Intramuscular   Manufacturer: [place]   Lot: [id number]   [place]: [address]-3

## 2020-05-06 ENCOUNTER — Other Ambulatory Visit: Payer: 59

## 2020-05-16 ENCOUNTER — Other Ambulatory Visit: Payer: 59

## 2020-05-16 DIAGNOSIS — Z20822 Contact with and (suspected) exposure to covid-19: Secondary | ICD-10-CM

## 2020-05-17 LAB — NOVEL CORONAVIRUS, NAA: SARS-CoV-2, NAA: DETECTED — AB

## 2020-05-17 LAB — SARS-COV-2, NAA 2 DAY TAT

## 2020-06-06 MED FILL — traZODone HCL 50 MG TABS: 50 | 90 days supply | Qty: 270 | Fill #2

## 2020-07-07 ENCOUNTER — Other Ambulatory Visit (HOSPITAL_COMMUNITY): Payer: Self-pay | Admitting: Obstetrics and Gynecology

## 2020-07-17 MED FILL — DULoxetine HCL 30 MG CPEP: 30 | 90 days supply | Qty: 180 | Fill #2

## 2020-10-05 ENCOUNTER — Other Ambulatory Visit: Payer: Self-pay | Admitting: Adult Health

## 2020-10-05 ENCOUNTER — Other Ambulatory Visit (HOSPITAL_COMMUNITY): Payer: Self-pay

## 2020-10-05 DIAGNOSIS — F411 Generalized anxiety disorder: Secondary | ICD-10-CM

## 2020-10-05 DIAGNOSIS — G4700 Insomnia, unspecified: Secondary | ICD-10-CM

## 2020-10-05 DIAGNOSIS — F331 Major depressive disorder, recurrent, moderate: Secondary | ICD-10-CM

## 2020-10-10 ENCOUNTER — Other Ambulatory Visit (HOSPITAL_COMMUNITY): Payer: Self-pay

## 2020-10-10 ENCOUNTER — Other Ambulatory Visit: Payer: Self-pay | Admitting: Adult Health

## 2020-10-10 DIAGNOSIS — F331 Major depressive disorder, recurrent, moderate: Secondary | ICD-10-CM

## 2020-10-10 DIAGNOSIS — G4700 Insomnia, unspecified: Secondary | ICD-10-CM

## 2020-10-10 DIAGNOSIS — F411 Generalized anxiety disorder: Secondary | ICD-10-CM

## 2020-10-11 ENCOUNTER — Other Ambulatory Visit (HOSPITAL_COMMUNITY): Payer: Self-pay

## 2020-10-12 ENCOUNTER — Other Ambulatory Visit (HOSPITAL_COMMUNITY): Payer: Self-pay

## 2020-10-14 ENCOUNTER — Other Ambulatory Visit (HOSPITAL_COMMUNITY): Payer: Self-pay

## 2020-10-14 ENCOUNTER — Telehealth: Payer: Self-pay | Admitting: Adult Health

## 2020-10-14 ENCOUNTER — Other Ambulatory Visit: Payer: Self-pay

## 2020-10-14 DIAGNOSIS — F331 Major depressive disorder, recurrent, moderate: Secondary | ICD-10-CM

## 2020-10-14 DIAGNOSIS — F411 Generalized anxiety disorder: Secondary | ICD-10-CM

## 2020-10-14 DIAGNOSIS — G4700 Insomnia, unspecified: Secondary | ICD-10-CM

## 2020-10-14 MED ORDER — DULOXETINE HCL 30 MG PO CPEP
ORAL_CAPSULE | Freq: Two times a day (BID) | ORAL | 0 refills | Status: DC
  Filled 2020-10-14: qty 60, 30d supply, fill #0

## 2020-10-14 MED ORDER — TRAZODONE HCL 50 MG PO TABS
ORAL_TABLET | ORAL | 0 refills | Status: DC
  Filled 2020-10-14: qty 90, 30d supply, fill #0

## 2020-10-14 NOTE — Telephone Encounter (Signed)
Pt called and said that she needs refills on her cymbalta and trazodone to be sent to [place]. She has an appointment on 6/28

## 2020-10-14 NOTE — Telephone Encounter (Signed)
Rx sent 

## 2020-10-17 ENCOUNTER — Other Ambulatory Visit (HOSPITAL_COMMUNITY): Payer: Self-pay

## 2020-10-17 ENCOUNTER — Telehealth: Payer: Self-pay

## 2020-10-17 NOTE — Telephone Encounter (Signed)
Prior renewal received for DULOXETINE 30 MG #180 effective 10/17/2020-10/17/2024 with [facility], PA# [id number]

## 2020-10-19 ENCOUNTER — Telehealth: Payer: 59 | Admitting: Adult Health

## 2020-10-20 ENCOUNTER — Encounter: Payer: Self-pay | Admitting: Adult Health

## 2020-10-20 ENCOUNTER — Other Ambulatory Visit: Payer: Self-pay

## 2020-10-20 ENCOUNTER — Ambulatory Visit (INDEPENDENT_AMBULATORY_CARE_PROVIDER_SITE_OTHER): Payer: 59 | Admitting: Adult Health

## 2020-10-20 ENCOUNTER — Other Ambulatory Visit (HOSPITAL_COMMUNITY): Payer: Self-pay

## 2020-10-20 DIAGNOSIS — G4700 Insomnia, unspecified: Secondary | ICD-10-CM

## 2020-10-20 DIAGNOSIS — F411 Generalized anxiety disorder: Secondary | ICD-10-CM

## 2020-10-20 DIAGNOSIS — F331 Major depressive disorder, recurrent, moderate: Secondary | ICD-10-CM | POA: Diagnosis not present

## 2020-10-20 MED ORDER — DULOXETINE HCL 30 MG PO CPEP
ORAL_CAPSULE | Freq: Two times a day (BID) | ORAL | 3 refills | Status: DC
  Filled 2020-10-20: qty 180, fill #0
  Filled 2020-12-21: qty 180, 90d supply, fill #0
  Filled 2021-03-24: qty 180, 90d supply, fill #1
  Filled 2021-06-20: qty 180, 90d supply, fill #2
  Filled 2021-09-26: qty 180, 90d supply, fill #3

## 2020-10-20 MED ORDER — TRAZODONE HCL 50 MG PO TABS
ORAL_TABLET | ORAL | 3 refills | Status: DC
  Filled 2020-10-20: qty 270, fill #0
  Filled 2020-11-16: qty 270, 90d supply, fill #0
  Filled 2021-03-24: qty 270, 90d supply, fill #1
  Filled 2021-06-20: qty 270, 90d supply, fill #2
  Filled 2021-09-26: qty 270, 90d supply, fill #3

## 2020-10-20 NOTE — Progress Notes (Signed)
[name] [id number] [birth year] 60 y.o.  Subjective:   Patient ID:  [name] is a 60 y.o. (DOB Apr 16, 1961) female.  Chief Complaint: No chief complaint on file.   HPI [name] presents to the office today for follow-up of GAD, MDD, and insomnia.   Describes mood today as "ok". Pleasant. Mood symptoms - denies depression, anxiety, and irritability. Stating "I'm doing really well". Recently moved to [place]. Family doing well. Feels like medications continue to work well for her. Stable interest and motivation.  Energy levels stable. Active, but does not have a regular exercise routine.   Enjoys some usual interests and activities. Married. Lives with husband and daughter. Spending time with family. Talking with friends and family.  Appetite adequate. Weight stable. Sleeps well most nights. Averages 6 - 8 hours with trazadone. Focus and concentration stable. Completing tasks. Managing aspects of household. Work going well. Denies SI or HI.  Denies AH or VH.   [facility] Office Visit from 09/21/2015 in [place] at [facility] Total Score 0        Review of Systems:  Review of Systems  Musculoskeletal:  Negative for gait problem.  Neurological:  Negative for tremors.  Psychiatric/Behavioral:         Please refer to HPI   Medications: I have reviewed the patient's current medications.  Current Outpatient Medications  Medication Sig Dispense Refill   aspirin EC 81 MG tablet Take 1 tablet (81 mg total) by mouth daily. (Patient not taking: Reported on 11/13/2019) 90 tablet 3   DULoxetine (CYMBALTA) 30 MG capsule TAKE 1 CAPSULE BY MOUTH TWICE DAILY 180 capsule 3   mesalamine (LIALDA) 1.2 g EC tablet Take 2 tablets by mouth daily with breakfast.     metroNIDAZOLE (METROGEL) 0.75 % vaginal gel INSERT 1 APPLICATORFUL VAGINALLY AT BEDTIME FOR 5 DAYS. 70 g 0   Probiotic Product (PROBIOTIC DAILY PO) Take 1 tablet by mouth  daily.     terbinafine (LAMISIL) 250 MG tablet Take 1 tablet (250 mg total) by mouth daily. After finishing 90 days of the medication you may stop taking. 60 tablet 0   terconazole (TERAZOL 3) 0.8 % vaginal cream Place 1 applicator vaginally as needed.     traZODone (DESYREL) 50 MG tablet TAKE 1 TO 3 TABLETS BY MOUTH AT BEDTIME 270 tablet 3   No current facility-administered medications for this visit.    Medication Side Effects: None  Allergies: No Known Allergies  No past medical history on file.  Past Medical History, Surgical history, Social history, and Family history were reviewed and updated as appropriate.   Please see review of systems for further details on the patient's review from today.   Objective:   Physical Exam:  There were no vitals taken for this visit.  Physical Exam Constitutional:      General: She is not in acute distress. Musculoskeletal:        General: No deformity.  Neurological:     Mental Status: She is alert and oriented to person, place, and time.     Coordination: Coordination normal.  Psychiatric:        Attention and Perception: Attention and perception normal. She does not perceive auditory or visual hallucinations.        Mood and Affect: Mood normal. Mood is not anxious or depressed. Affect is not labile, blunt, angry or inappropriate.        Speech: Speech  normal.        Behavior: Behavior normal.        Thought Content: Thought content normal. Thought content is not paranoid or delusional. Thought content does not include homicidal or suicidal ideation. Thought content does not include homicidal or suicidal plan.        Cognition and Memory: Cognition and memory normal.        Judgment: Judgment normal.     Comments: Insight intact    Lab Review:     Component Value Date/Time   NA 140 06/26/2018 1115   K 4.3 06/26/2018 1115   CL 102 06/26/2018 1115   CO2 27 06/26/2018 1115   GLUCOSE 99 06/26/2018 1115   GLUCOSE 98 03/26/2018  1806   BUN 18 06/26/2018 1115   CREATININE 0.85 06/26/2018 1115   CALCIUM 9.7 06/26/2018 1115   CALCIUM 9.5 05/01/2016 0000   PROT 6.4 12/03/2019 1004   PROT 6.7 06/26/2018 1115   ALBUMIN 4.4 06/26/2018 1115   AST 17 12/03/2019 1004   ALT 15 12/03/2019 1004   ALKPHOS 66 06/26/2018 1115   BILITOT 0.5 12/03/2019 1004   BILITOT 0.3 06/26/2018 1115   GFRNONAA 77 06/26/2018 1115   GFRAA 89 06/26/2018 1115       Component Value Date/Time   WBC 7.4 03/26/2018 1806   RBC 4.23 03/26/2018 1806   HGB 11.9 (L) 03/26/2018 1806   HCT 38.1 03/26/2018 1806   PLT 224 03/26/2018 1806   MCV 90.1 03/26/2018 1806   MCH 28.1 03/26/2018 1806   MCHC 31.2 03/26/2018 1806   RDW 13.2 03/26/2018 1806   LYMPHSABS 1.8 03/26/2018 1806   MONOABS 0.6 03/26/2018 1806   EOSABS 0.1 03/26/2018 1806   BASOSABS 0.0 03/26/2018 1806    No results found for: POCLITH, LITHIUM   No results found for: PHENYTOIN, PHENOBARB, VALPROATE, CBMZ   .res Assessment: Plan:    Plan:  Cymbalta 60mg  daily. Trazadone 50mg  - 1 to 3 tabs at hs  RTC 6 months  Patient advised to contact office with any questions, adverse effects, or acute worsening in signs and symptoms. Diagnoses and all orders for this visit:  Major depressive disorder, recurrent episode, moderate (HCC) -     traZODone (DESYREL) 50 MG tablet; TAKE 1 TO 3 TABLETS BY MOUTH AT BEDTIME -     DULoxetine (CYMBALTA) 30 MG capsule; TAKE 1 CAPSULE BY MOUTH TWICE DAILY  Insomnia, unspecified type -     traZODone (DESYREL) 50 MG tablet; TAKE 1 TO 3 TABLETS BY MOUTH AT BEDTIME  Generalized anxiety disorder -     DULoxetine (CYMBALTA) 30 MG capsule; TAKE 1 CAPSULE BY MOUTH TWICE DAILY    Please see After Visit Summary for patient specific instructions.  Future Appointments  Date Time Provider [place]  10/20/2021 10:00 AM Kaenan Jake, Berdie Ogren, NP [facility] None    No orders of the defined types were placed in this  encounter.   -------------------------------

## 2020-11-04 ENCOUNTER — Other Ambulatory Visit (HOSPITAL_COMMUNITY): Payer: Self-pay

## 2020-11-16 ENCOUNTER — Other Ambulatory Visit (HOSPITAL_COMMUNITY): Payer: Self-pay

## 2020-11-22 ENCOUNTER — Other Ambulatory Visit (HOSPITAL_COMMUNITY): Payer: Self-pay

## 2020-12-21 ENCOUNTER — Other Ambulatory Visit (HOSPITAL_COMMUNITY): Payer: Self-pay

## 2021-03-24 ENCOUNTER — Other Ambulatory Visit (HOSPITAL_COMMUNITY): Payer: Self-pay

## 2021-04-12 ENCOUNTER — Other Ambulatory Visit (HOSPITAL_COMMUNITY): Payer: Self-pay

## 2021-04-12 MED ORDER — TRIAMCINOLONE ACETONIDE 0.1 % MT PSTE
PASTE | OROMUCOSAL | 99 refills | Status: DC
  Filled 2021-04-12 (×2): qty 5, 5d supply, fill #0

## 2021-06-20 ENCOUNTER — Other Ambulatory Visit (HOSPITAL_COMMUNITY): Payer: Self-pay

## 2021-07-26 ENCOUNTER — Other Ambulatory Visit: Payer: Self-pay | Admitting: Internal Medicine

## 2021-07-27 ENCOUNTER — Other Ambulatory Visit: Payer: Self-pay | Admitting: Internal Medicine

## 2021-07-27 DIAGNOSIS — R931 Abnormal findings on diagnostic imaging of heart and coronary circulation: Secondary | ICD-10-CM

## 2021-08-23 ENCOUNTER — Other Ambulatory Visit (HOSPITAL_COMMUNITY): Payer: Self-pay

## 2021-08-23 MED ORDER — LOMAIRA 8 MG PO TABS
ORAL_TABLET | ORAL | 4 refills | Status: DC
  Filled 2021-08-23: qty 30, 30d supply, fill #0
  Filled 2021-09-26: qty 30, 30d supply, fill #1

## 2021-08-30 ENCOUNTER — Ambulatory Visit
Admission: RE | Admit: 2021-08-30 | Discharge: 2021-08-30 | Disposition: A | Discharge status: Home / Self Care | Payer: No Typology Code available for payment source | Source: Ambulatory Visit | Attending: Internal Medicine | Admitting: Internal Medicine

## 2021-08-30 DIAGNOSIS — R931 Abnormal findings on diagnostic imaging of heart and coronary circulation: Secondary | ICD-10-CM

## 2021-09-26 ENCOUNTER — Other Ambulatory Visit (HOSPITAL_COMMUNITY): Payer: Self-pay

## 2021-09-27 ENCOUNTER — Other Ambulatory Visit (HOSPITAL_COMMUNITY): Payer: Self-pay

## 2021-10-05 ENCOUNTER — Ambulatory Visit (INDEPENDENT_AMBULATORY_CARE_PROVIDER_SITE_OTHER): Payer: 59

## 2021-10-05 ENCOUNTER — Ambulatory Visit (INDEPENDENT_AMBULATORY_CARE_PROVIDER_SITE_OTHER): Payer: 59 | Admitting: Podiatry

## 2021-10-05 ENCOUNTER — Encounter: Payer: Self-pay | Admitting: Podiatry

## 2021-10-05 DIAGNOSIS — M21612 Bunion of left foot: Secondary | ICD-10-CM | POA: Diagnosis not present

## 2021-10-05 DIAGNOSIS — B070 Plantar wart: Secondary | ICD-10-CM | POA: Diagnosis not present

## 2021-10-05 DIAGNOSIS — M21619 Bunion of unspecified foot: Secondary | ICD-10-CM | POA: Diagnosis not present

## 2021-10-05 DIAGNOSIS — M2042 Other hammer toe(s) (acquired), left foot: Secondary | ICD-10-CM | POA: Diagnosis not present

## 2021-10-05 DIAGNOSIS — M21611 Bunion of right foot: Secondary | ICD-10-CM | POA: Diagnosis not present

## 2021-10-06 NOTE — Progress Notes (Signed)
Subjective:   Patient ID: [name], female   DOB: 61 y.o.   MRN: [id number]   HPI Patient presents with 2 problems with 1 being significant bunion deformity left and elevated second toe which is gradually gotten worse over the last several years and are increasingly tender for her and on the right foot there is lesion on the right fifth digit and right distal fourth metatarsal that have been present recently.  Patient states she tries wider shoes she has tried a combination without relief of symptoms and cushioning   ROS      Objective:  Physical Exam  Neurovascular status intact muscle strength found to be adequate range of motion adequate.  Significant structural bunion deformity left with elevation of the intermetatarsal angle rotation of the left big toe against the second toe with elevation of the second toe with redness on top of the toe.  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  I did discuss bunion deformity and I reviewed the considerations conservatively and surgically.  I do think we could consider a surgical correction and it would be a aggressive distal osteotomy along with a digital fusion and probable Aiken osteotomy.  I reviewed what would be done and reviewed her x-rays and I do think long-term this would be in her best interest and she wants to pursue this at 1 point in future but does not know her schedule.  For the right I went ahead I debrided the lesions I applied chemical agent to create immune response with sterile dressing and explained what to do if any type of blistering were to occur.  X-rays indicated there is elevation of the intermetatarsal  angle left of significant nature approximate 15 degrees deviation left hallux again second toe with elevated second toe minimal deformity on the right foot noted

## 2021-10-20 ENCOUNTER — Other Ambulatory Visit (HOSPITAL_COMMUNITY): Payer: Self-pay

## 2021-10-20 ENCOUNTER — Ambulatory Visit: Payer: 59 | Admitting: Adult Health

## 2021-10-20 MED ORDER — TRAZODONE HCL 50 MG PO TABS
ORAL_TABLET | ORAL | 3 refills | Status: DC
  Filled 2021-10-20: qty 270, 90d supply, fill #0
  Filled 2022-04-09 (×2): qty 270, 90d supply, fill #1
  Filled 2022-07-03: qty 270, 90d supply, fill #2
  Filled 2022-09-28: qty 270, 90d supply, fill #3

## 2021-10-20 MED ORDER — DULOXETINE HCL 30 MG PO CPEP
ORAL_CAPSULE | ORAL | 3 refills | Status: DC
  Filled 2021-10-20: qty 180, 90d supply, fill #0
  Filled 2022-04-09 (×2): qty 180, 90d supply, fill #1
  Filled 2022-07-03: qty 180, 90d supply, fill #2
  Filled 2022-09-28: qty 180, 90d supply, fill #3

## 2021-11-01 ENCOUNTER — Emergency Department (HOSPITAL_BASED_OUTPATIENT_CLINIC_OR_DEPARTMENT_OTHER): Payer: 59

## 2021-11-01 ENCOUNTER — Encounter (HOSPITAL_BASED_OUTPATIENT_CLINIC_OR_DEPARTMENT_OTHER): Payer: Self-pay

## 2021-11-01 ENCOUNTER — Inpatient Hospital Stay (HOSPITAL_BASED_OUTPATIENT_CLINIC_OR_DEPARTMENT_OTHER)
Admission: EM | Admit: 2021-11-01 | Discharge: 2021-11-05 | DRG: 866 | Disposition: A | Discharge status: Home / Self Care | Payer: 59 | Attending: Internal Medicine | Admitting: Internal Medicine

## 2021-11-01 ENCOUNTER — Other Ambulatory Visit: Payer: Self-pay

## 2021-11-01 DIAGNOSIS — M47812 Spondylosis without myelopathy or radiculopathy, cervical region: Secondary | ICD-10-CM | POA: Diagnosis present

## 2021-11-01 DIAGNOSIS — Z9049 Acquired absence of other specified parts of digestive tract: Secondary | ICD-10-CM

## 2021-11-01 DIAGNOSIS — R5383 Other fatigue: Secondary | ICD-10-CM | POA: Diagnosis not present

## 2021-11-01 DIAGNOSIS — Z87891 Personal history of nicotine dependence: Secondary | ICD-10-CM

## 2021-11-01 DIAGNOSIS — R609 Edema, unspecified: Secondary | ICD-10-CM | POA: Diagnosis present

## 2021-11-01 DIAGNOSIS — D352 Benign neoplasm of pituitary gland: Secondary | ICD-10-CM | POA: Diagnosis present

## 2021-11-01 DIAGNOSIS — R519 Headache, unspecified: Secondary | ICD-10-CM

## 2021-11-01 DIAGNOSIS — K5190 Ulcerative colitis, unspecified, without complications: Secondary | ICD-10-CM | POA: Diagnosis present

## 2021-11-01 DIAGNOSIS — A938 Other specified arthropod-borne viral fevers: Principal | ICD-10-CM | POA: Diagnosis present

## 2021-11-01 DIAGNOSIS — Z20822 Contact with and (suspected) exposure to covid-19: Secondary | ICD-10-CM | POA: Diagnosis present

## 2021-11-01 DIAGNOSIS — D6959 Other secondary thrombocytopenia: Secondary | ICD-10-CM | POA: Diagnosis present

## 2021-11-01 DIAGNOSIS — R7401 Elevation of levels of liver transaminase levels: Secondary | ICD-10-CM | POA: Diagnosis present

## 2021-11-01 DIAGNOSIS — R7989 Other specified abnormal findings of blood chemistry: Secondary | ICD-10-CM

## 2021-11-01 DIAGNOSIS — F32A Depression, unspecified: Secondary | ICD-10-CM | POA: Diagnosis present

## 2021-11-01 DIAGNOSIS — D61818 Other pancytopenia: Secondary | ICD-10-CM | POA: Diagnosis present

## 2021-11-01 DIAGNOSIS — Z79899 Other long term (current) drug therapy: Secondary | ICD-10-CM

## 2021-11-01 DIAGNOSIS — M791 Myalgia, unspecified site: Secondary | ICD-10-CM | POA: Diagnosis present

## 2021-11-01 LAB — BASIC METABOLIC PANEL
Anion gap: 12 (ref 5–15)
BUN: 12 mg/dL (ref 6–20)
CO2: 26 mmol/L (ref 22–32)
Calcium: 9.5 mg/dL (ref 8.9–10.3)
Chloride: 100 mmol/L (ref 98–111)
Creatinine, Ser: 0.64 mg/dL (ref 0.44–1.00)
GFR, Estimated: >60 mL/min (ref >60)
Glucose, Bld: 122 mg/dL — ABNORMAL HIGH (ref 70–99)
Potassium: 3.7 mmol/L (ref 3.5–5.1)
Sodium: 138 mmol/L (ref 135–145)

## 2021-11-01 LAB — HEPATIC FUNCTION PANEL
ALT: 386 U/L — ABNORMAL HIGH (ref 0–44)
AST: 420 U/L — ABNORMAL HIGH (ref 15–41)
Albumin: 4.4 g/dL (ref 3.5–5.0)
Alkaline Phosphatase: 373 U/L — ABNORMAL HIGH (ref 38–126)
Bilirubin, Direct: 0.8 mg/dL — ABNORMAL HIGH (ref 0.0–0.2)
Indirect Bilirubin: 0.7 mg/dL (ref 0.3–0.9)
Total Bilirubin: 1.5 mg/dL — ABNORMAL HIGH (ref 0.3–1.2)
Total Protein: 6.9 g/dL (ref 6.5–8.1)

## 2021-11-01 LAB — CBC
HCT: 42.3 % (ref 36.0–46.0)
Hemoglobin: 13.7 g/dL (ref 12.0–15.0)
MCH: 29 pg (ref 26.0–34.0)
MCHC: 32.4 g/dL (ref 30.0–36.0)
MCV: 89.6 fL (ref 80.0–100.0)
Platelets: 133 10*3/uL — ABNORMAL LOW (ref 150–400)
RBC: 4.72 MIL/uL (ref 3.87–5.11)
RDW: 13.5 % (ref 11.5–15.5)
WBC: 2.1 10*3/uL — ABNORMAL LOW (ref 4.0–10.5)
nRBC: 0 % (ref 0.0–0.2)

## 2021-11-01 LAB — HEPATITIS PANEL, ACUTE
HCV Ab: NONREACTIVE
Hep A IgM: NONREACTIVE
Hep B C IgM: NONREACTIVE
Hepatitis B Surface Ag: NONREACTIVE

## 2021-11-01 LAB — DIFFERENTIAL
Abs Immature Granulocytes: 0.01 10*3/uL (ref 0.00–0.07)
Basophils Absolute: 0 10*3/uL (ref 0.0–0.1)
Basophils Relative: 1 %
Eosinophils Absolute: 0 10*3/uL (ref 0.0–0.5)
Eosinophils Relative: 0 %
Immature Granulocytes: 1 %
Lymphocytes Relative: 13 %
Lymphs Abs: 0.3 10*3/uL — ABNORMAL LOW (ref 0.7–4.0)
Monocytes Absolute: 0.2 10*3/uL (ref 0.1–1.0)
Monocytes Relative: 7 %
Neutro Abs: 1.7 10*3/uL (ref 1.7–7.7)
Neutrophils Relative %: 78 %

## 2021-11-01 LAB — URINALYSIS, ROUTINE W REFLEX MICROSCOPIC
Glucose, UA: NEGATIVE mg/dL
Hgb urine dipstick: NEGATIVE
Ketones, ur: NEGATIVE mg/dL
Nitrite: NEGATIVE
Protein, ur: 100 mg/dL — AB
Specific Gravity, Urine: 1.038 — ABNORMAL HIGH (ref 1.005–1.030)
pH: 6.5 (ref 5.0–8.0)

## 2021-11-01 LAB — RESP PANEL BY RT-PCR (FLU A&B, COVID) ARPGX2
Influenza A by PCR: NEGATIVE
Influenza B by PCR: NEGATIVE
SARS Coronavirus 2 by RT PCR: NEGATIVE

## 2021-11-01 LAB — MONONUCLEOSIS SCREEN: Mono Screen: NEGATIVE

## 2021-11-01 LAB — LIPASE, BLOOD: Lipase: 22 U/L (ref 11–51)

## 2021-11-01 MED ORDER — LACTATED RINGERS IV BOLUS
1000.0000 mL | Freq: Once | INTRAVENOUS | Status: AC
  Administered 2021-11-01: 1000 mL via INTRAVENOUS

## 2021-11-01 MED ORDER — ONDANSETRON HCL 4 MG/2ML IJ SOLN
4.0000 mg | Freq: Once | INTRAMUSCULAR | Status: AC
  Administered 2021-11-01: 4 mg via INTRAVENOUS
  Filled 2021-11-01: qty 2

## 2021-11-01 MED ORDER — IBUPROFEN 800 MG PO TABS: 800.0000 mg | ORAL_TABLET | Freq: Three times a day (TID) | ORAL | 0 refills | Status: DC

## 2021-11-01 MED ORDER — CYCLOBENZAPRINE HCL 10 MG PO TABS: 10.0000 mg | ORAL_TABLET | Freq: Two times a day (BID) | ORAL | 0 refills | Status: DC | PRN

## 2021-11-01 MED ORDER — IOHEXOL 300 MG/ML  SOLN
100.0000 mL | Freq: Once | INTRAMUSCULAR | Status: AC | PRN
  Administered 2021-11-01: 85 mL via INTRAVENOUS

## 2021-11-01 MED ORDER — METOCLOPRAMIDE HCL 5 MG/ML IJ SOLN
10.0000 mg | Freq: Once | INTRAMUSCULAR | Status: AC
  Administered 2021-11-01: 10 mg via INTRAVENOUS
  Filled 2021-11-01: qty 2

## 2021-11-01 MED ORDER — ONDANSETRON HCL 4 MG/2ML IJ SOLN
4.0000 mg | Freq: Once | INTRAMUSCULAR | Status: AC
Start: 2021-11-01 — End: 2021-11-01
  Administered 2021-11-01: 4 mg via INTRAVENOUS
  Filled 2021-11-01: qty 2

## 2021-11-01 MED ORDER — ONDANSETRON HCL 4 MG PO TABS: 4.0000 mg | ORAL_TABLET | Freq: Four times a day (QID) | ORAL | 0 refills | Status: DC

## 2021-11-01 MED ORDER — MORPHINE SULFATE (PF) 4 MG/ML IV SOLN
4.0000 mg | Freq: Once | INTRAVENOUS | Status: AC
  Administered 2021-11-01: 4 mg via INTRAVENOUS
  Filled 2021-11-01: qty 1

## 2021-11-01 NOTE — ED Provider Notes (Signed)
[facility] EMERGENCY DEPT Provider Note   CSN: [id number] Arrival date & time: 11/01/21  1241     History  Chief Complaint  Patient presents with   Fatigue    [name] is a 61 y.o. female with history of appendectomy cholecystectomy who presents to the ED for evaluation of fairly sudden onset fatigue, chills, back and neck pain.  Patient states that she was in [place] and they drove home Sunday morning and she felt completely fine.  By Sunday night, patient states that she began to feel very fatigued and tired.  She started having severe body aches, particular on her back rating up into her neck and now causing headache.  She states that she just feels "sore".  She went to her PCP and they did some labs.  Her PCP works with [facility] and called patient today to say that she had low white blood cell, red blood cell and platelet counts as well as some elevated liver function tests.  They were going to add on additional testing including mono and RMSF, however these results would not return for a few days and patient states that she did not think that she would make it through the weekend without treatment.  She notes that she has never felt like this before.  She denies chest pain, shortness of breath, abdominal pain, nausea, vomiting, diarrhea and vision changes.  She does not remember pulling a tick off of her although she states that they go to the woods nearly every weekend.  Patient states that she has history of ulcerative colitis although this has been well managed for over 25 years and she has not had a flareup since she was first diagnosed.  HPI     Home Medications Prior to Admission medications   Medication Sig Start Date End Date Taking? Authorizing Provider  cyclobenzaprine (FLEXERIL) 10 MG tablet Take 1 tablet (10 mg total) by mouth 2 (two) times daily as needed for muscle spasms. 11/01/21  Yes Kathe Becton R, PA-C  ibuprofen (ADVIL) 800 MG  tablet Take 1 tablet (800 mg total) by mouth 3 (three) times daily. 11/01/21  Yes Kathe Becton R, PA-C  ondansetron (ZOFRAN) 4 MG tablet Take 1 tablet (4 mg total) by mouth every 6 (six) hours. 11/01/21  Yes Tonye Pearson, PA-C  aspirin EC 81 MG tablet Take 1 tablet (81 mg total) by mouth daily. Patient not taking: Reported on 11/13/2019 06/26/18   Revankar, Reita Cliche, MD  DULoxetine (CYMBALTA) 30 MG capsule Take 1 capsule by mouth twice a day 10/20/21     mesalamine (LIALDA) 1.2 g EC tablet Take 2 tablets by mouth daily with breakfast. 10/26/09   [provider]  Phentermine HCl (LOMAIRA) 8 MG TABS Take 1 tablet by mouth 30 minutes before a meal once daily 08/23/21     Probiotic Product (PROBIOTIC DAILY PO) Take 1 tablet by mouth daily.    [provider]  terbinafine (LAMISIL) 250 MG tablet Take 1 tablet (250 mg total) by mouth daily. After finishing 90 days of the medication you may stop taking. 12/16/19   Bronson Ing, DPM  terconazole (TERAZOL 3) 0.8 % vaginal cream Place 1 applicator vaginally as needed. 01/13/10   [provider]  traZODone (DESYREL) 50 MG tablet Take 1-3 tablets by mouth nightly at bedtime as needed 10/20/21     triamcinolone (KENALOG) 0.1 % paste Apply to affected area 4 times a day after meals and at bedtime 04/12/21  Allergies    Patient has no known allergies.    Review of Systems   Review of Systems  Constitutional:  Positive for fatigue.  Respiratory:  Negative for shortness of breath.   Cardiovascular:  Negative for chest pain.  Gastrointestinal:  Negative for nausea and vomiting.  Musculoskeletal:  Positive for neck pain.  Neurological:  Positive for headaches. Negative for syncope and numbness.    Physical Exam Updated Vital Signs BP 106/70   Pulse 91   Temp 98.4 F (36.9 C) (Oral)   Resp 18   Ht '5\' 7"'$  (1.702 m)   Wt 84.8 kg   SpO2 94%   BMI 29.28 kg/m  Physical Exam Vitals and nursing note reviewed.   Constitutional:      General: She is not in acute distress.    Appearance: She is ill-appearing. She is not diaphoretic.     Comments: Appears pale, has trouble keeping eyes open during exam   HENT:     Head: Atraumatic.     Mouth/Throat:     Mouth: Mucous membranes are dry.  Eyes:     General: No scleral icterus.    Conjunctiva/sclera: Conjunctivae normal.  Neck:     Comments: ROM limited, neck stiffness. No midline TTP Cardiovascular:     Rate and Rhythm: Normal rate and regular rhythm.     Pulses: Normal pulses.     Heart sounds: No murmur heard. Pulmonary:     Effort: Pulmonary effort is normal. No respiratory distress.     Breath sounds: Normal breath sounds.  Abdominal:     General: Abdomen is flat. There is no distension.     Palpations: Abdomen is soft.     Tenderness: There is no abdominal tenderness.  Musculoskeletal:        General: Normal range of motion.     Comments: No midline or paraspinal T spine or L spine tenderness. Pt states that she just feels "sore" when pressing on musculature of back  Skin:    General: Skin is warm and dry.     Capillary Refill: Capillary refill takes less than 2 seconds.  Neurological:     General: No focal deficit present.     Mental Status: She is alert.  Psychiatric:        Mood and Affect: Mood normal.     ED Results / Procedures / Treatments   Labs (all labs ordered are listed, but only abnormal results are displayed) Labs Reviewed  BASIC METABOLIC PANEL - Abnormal; Notable for the following components:      Result Value   Glucose, Bld 122 (*)    All other components within normal limits  CBC - Abnormal; Notable for the following components:   WBC 2.1 (*)    Platelets 133 (*)    All other components within normal limits  URINALYSIS, ROUTINE W REFLEX MICROSCOPIC - Abnormal; Notable for the following components:   Specific Gravity, Urine 1.038 (*)    Bilirubin Urine SMALL (*)    Protein, ur 100 (*)    Leukocytes,Ua  TRACE (*)    All other components within normal limits  HEPATIC FUNCTION PANEL - Abnormal; Notable for the following components:   AST 420 (*)    ALT 386 (*)    Alkaline Phosphatase 373 (*)    Total Bilirubin 1.5 (*)    Bilirubin, Direct 0.8 (*)    All other components within normal limits  DIFFERENTIAL - Abnormal; Notable for the following components:  Lymphs Abs 0.3 (*)    All other components within normal limits  RESP PANEL BY RT-PCR (FLU A&B, COVID) ARPGX2  LIPASE, BLOOD  MONONUCLEOSIS SCREEN  HEPATITIS PANEL, ACUTE    EKG EKG Interpretation  Date/Time:  Wednesday November 01 2021 13:09:10 EDT Ventricular Rate:  88 PR Interval:  158 QRS Duration: 82 QT Interval:  344 QTC Calculation: 416 R Axis:   74 Text Interpretation: Normal sinus rhythm Cannot rule out Anterior infarct , age undetermined Abnormal ECG When compared with ECG of 26-Mar-2018 16:51, PREVIOUS ECG IS PRESENT No significant change since last tracing Confirmed by Fredia Sorrow [phone]) on 11/01/2021 2:19:57 PM  Radiology CT ABDOMEN PELVIS W CONTRAST  Result Date: 11/01/2021 CLINICAL DATA:  Abdominal pain, acute, nonlocalized.  Elevated LFTs. EXAM: CT ABDOMEN AND PELVIS WITH CONTRAST TECHNIQUE: Multidetector CT imaging of the abdomen and pelvis was performed using the standard protocol following bolus administration of intravenous contrast. RADIATION DOSE REDUCTION: This exam was performed according to the departmental dose-optimization program which includes automated exposure control, adjustment of the mA and/or kV according to patient size and/or use of iterative reconstruction technique. CONTRAST:  54m OMNIPAQUE IOHEXOL 300 MG/ML  SOLN COMPARISON:  CT of the abdomen pelvis 08/15/2012. FINDINGS: Lower chest: The lung bases are clear without focal nodule, mass, mild dependent atelectasis is present. No significant airspace consolidation present. Heart size is normal. Hepatobiliary: Mild periportal edema is present.  No discrete lesions are present. The common bile duct is within normal limits for age following cholecystectomy. Pancreas: Slight dilation the pancreatic duct is stable. No obstructing lesion is present. Spleen: Normal in size without focal abnormality. Adrenals/Urinary Tract: Adrenal glands are unremarkable. Kidneys are normal, without renal calculi, focal lesion, or hydronephrosis. An 8 mm simple cyst is noted in the right kidney. Recommend a follow-up bladder is unremarkable. Stomach/Bowel: Stomach and duodenum are within normal limits. Small bowel is unremarkable. The terminal ileum is within normal limits. Appendix is surgically absent. The ascending and transverse colon are within normal limits. Descending and sigmoid colon are normal. Vascular/Lymphatic: No significant vascular findings are present. No enlarged abdominal or pelvic lymph nodes. Reproductive: Uterus and bilateral adnexa are unremarkable. Clips are noted on the flow being tubes. Other: No abdominal wall hernia or abnormality. No abdominopelvic ascites. Musculoskeletal: Grade 1 degenerative anterolisthesis is present at L4-5. Vertebral body heights and alignment are otherwise normal. No focal osseous lesions are present. IMPRESSION: 1. No acute or focal lesion to explain the patient's abdominal pain. 2. Mild periportal edema is nonspecific, but can be seen in the setting of acute hepatitis. 3. Cholecystectomy and appendectomy. 4. Grade 1 degenerative anterolisthesis at L4-5. Electronically Signed   By: CSan MorelleM.D.   On: 11/01/2021 18:02   CT Cervical Spine Wo Contrast  Result Date: 11/01/2021 CLINICAL DATA:  Neck pain, infection suspected, no prior imaging. [place] has been experiencing chills, neck pain, fatigue, headache and nausea EXAM: CT CERVICAL SPINE WITHOUT CONTRAST TECHNIQUE: Multidetector CT imaging of the cervical spine was performed without intravenous contrast. Multiplanar CT  image reconstructions were also generated. RADIATION DOSE REDUCTION: This exam was performed according to the departmental dose-optimization program which includes automated exposure control, adjustment of the mA and/or kV according to patient size and/or use of iterative reconstruction technique. COMPARISON:  None Available. FINDINGS: Alignment: There is minimal reversal of the normal lordotic curvature seen. Minimal anterolisthesis of C2 over C3. Skull base and vertebrae: No acute fracture. No primary bone lesion or  focal pathologic process. Bilateral mastoid air cells and middle and external ear canals have a normal appearance. Mild cervical spondylosis. Soft tissues and spinal canal: No prevertebral fluid or swelling. No visible canal hematoma. Disc levels:  Disc levels are well-maintained. Upper chest: Negative. Other: None. IMPRESSION: Minimal reversal of the normal lordotic curvature of the cervical spine, which may be due to pain, spasm or position. Mild cervical spondylosis. Electronically Signed   By: Frazier Richards M.D.   On: 11/01/2021 15:04   CT Head Wo Contrast  Result Date: 11/01/2021 CLINICAL DATA:  Headache, sudden, severe EXAM: CT HEAD WITHOUT CONTRAST TECHNIQUE: Contiguous axial images were obtained from the base of the skull through the vertex without intravenous contrast. RADIATION DOSE REDUCTION: This exam was performed according to the departmental dose-optimization program which includes automated exposure control, adjustment of the mA and/or kV according to patient size and/or use of iterative reconstruction technique. COMPARISON:  None Available. FINDINGS: Brain: No evidence of acute infarction, hemorrhage, hydrocephalus, extra-axial collection or visible mass lesion/mass effect. 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 Vascular: No hyperdense vessel identified. Skull: No acute fracture. Sinuses/Orbits: Visualized sinuses are clear. No acute orbital findings. Other:  No mastoid effusions. IMPRESSION: No evidence of acute intracranial abnormality. Electronically Signed   By: Margaretha Sheffield M.D.   On: 11/01/2021 14:56   DG Chest Portable 1 View  Result Date: 11/01/2021 CLINICAL DATA:  Shortness of breath EXAM: PORTABLE CHEST 1 VIEW COMPARISON:  December 2018 FINDINGS: The heart size and mediastinal contours are within normal limits. Both lungs are clear. No pleural effusion or pneumothorax. The visualized skeletal structures are unremarkable. IMPRESSION: No acute process in the chest. Electronically Signed   By: Macy Mis M.D.   On: 11/01/2021 14:54    Procedures Procedures    Medications Ordered in ED Medications  lactated ringers bolus 1,000 mL (0 mLs Intravenous Stopped 11/01/21 1806)  ondansetron (ZOFRAN) injection 4 mg (4 mg Intravenous Given 11/01/21 1658)  morphine (PF) 4 MG/ML injection 4 mg (4 mg Intravenous Given 11/01/21 1659)  iohexol (OMNIPAQUE) 300 MG/ML solution 100 mL (85 mLs Intravenous Contrast Given 11/01/21 1724)  ondansetron (ZOFRAN) injection 4 mg (4 mg Intravenous Given 11/01/21 2003)  metoCLOPramide (REGLAN) injection 10 mg (10 mg Intravenous Given 11/01/21 2301)  lactated ringers bolus 1,000 mL (1,000 mLs Intravenous New Bag/Given 11/01/21 2301)    ED Course/ Medical Decision Making/ A&P                           Medical Decision Making Amount and/or Complexity of Data Reviewed Labs: ordered. Radiology: ordered.  Risk Prescription drug management.   Social determinants of health:  Social History   Socioeconomic History   Marital status: Married    Spouse name: Not on file   Number of children: Not on file   Years of education: Not on file   Highest education level: Not on file  Occupational History   Not on file  Tobacco Use   Smoking status: Never   Smokeless tobacco: Never  Substance and Sexual Activity   Alcohol use: Yes   Drug use: No   Sexual activity: Yes    Birth control/protection: Other-see  comments    Comment: endometrial ablation  Other Topics Concern   Not on file  Social History Narrative   Not on file   Social Determinants of Health   Financial Resource Strain: Not on file  Food Insecurity: Not  on file  Transportation Needs: Not on file  Physical Activity: Not on file  Stress: Not on file  Social Connections: Not on file  Intimate Partner Violence: Not on file     Initial impression:  This patient presents to the ED for concern of fatigue, , this involves an extensive number of treatment options, and is a complaint that carries with it a high risk of complications and morbidity.   Differentials include anemia, RMSF, mono, meningitis, viral URI,    Comorbidities affecting care:  Ulcerative colitis  Additional history obtained: Husband  Lab Tests  I Ordered, reviewed, and interpreted labs and EKG.  The pertinent results include:  Leukopenia 2.1, thrombocytopenia 133 BMP normal Hepatic function with elevated AST and ALT, elevated bilirubin Negative lipase, negative COVID flu Negative mono Pending hepatitis panel  Imaging Studies ordered:  I ordered imaging studies including  CT head and C-spine which was without acute findings CT abdomen and pelvis which was without acute findings.  There was mild periportal edema I independently visualized and interpreted imaging and I agree with the radiologist interpretation.   EKG: Normal sinus rhythm  Cardiac Monitoring:  The patient was maintained on a cardiac monitor.  I personally viewed and interpreted the cardiac monitored which showed an underlying rhythm of: Normal sinus rhythm   Medicines ordered and prescription drug management:  I ordered medication including: 1 L LR bolus Morphine 4 mg Zofran 4 mg Reevaluation of the patient after these medicines showed that the patient improved I have reviewed the patients home medicines and have made adjustments as needed    Consultations  Obtained:  I requested consultation with infectious disease and spoke with Dr. Candiss Norse,  and discussed lab and imaging findings as well as pertinent plan -she has low suspicion for malaria given that patient was not in a particular area endemic region to it and does not have any GI symptoms that support this.  Symptoms are more likely viral   ED Course/Re-evaluation: Patient appears fatigued during exam, having trouble keeping her eyes open while speaking.  Her vitals were without significant abnormality, she is satting well on room air.  Lungs CTA bilaterally.  There is no abdominal tenderness and is otherwise soft and nondistended.  She does have some muscle stiffness around her neck although no meningeal signs.  No midline C-spine tenderness.  Eyes PERRL, EOM intact.  I obtained CT head and C-spine out of abundance of caution given her severe back neck and head pain which were both negative.  Chest x-ray was also normal.  CT abdomen without acute findings although there was some peri or lymphedema that was noted to the generally nonspecific but can be sitting in the setting of hepatitis.  LFTs elevated to the 3 and 400s and UA concerning for dehydration.  Labs were otherwise fairly unremarkable.  I discussed this case with my attending, Dr. Rogene Houston who also expressed concern for possible malaria.  I called infectious disease to discuss this case and given patient's clinical exam findings and history, they have low suspicion that this could be the cause. My attending Dr. Rogene Houston also evaluated the patient and had low concern for possible meningitis based on her physical exam.  He suspect symptoms are more related to underlying hepatitis and recommends discharge home for symptomatic management and follow-up with PCP when hepatitis panel returns. After monitoring in the ER, I did talk to patient and discussed plan for discharge.  Initially, they were amenable to this plan, however after  some time, they  notified RN that they were uncomfortable discharging home given the level of fatigue and, neck and head pain that patient was suffering.  They were concerned about aseptic viral meningitis.  At this point, Dr. Rogene Houston had already left, so my other attending, Dr. Kathrynn Humble went to go speak to the patient and family directly.  Due to patient's discomfort with discharging home, he agrees to admit patient for observation.  Patient care handed off to Dr Kathrynn Humble and will contact hospitalist for admission.  Disposition:   Fatigue Headache Elevated LFTs: P  Final Clinical Impression(s) / ED Diagnoses Final diagnoses:  Other fatigue  Acute nonintractable headache, unspecified headache type  Elevated LFTs    Rx / DC Orders ED Discharge Orders          Ordered    ondansetron (ZOFRAN) 4 MG tablet  Every 6 hours        11/01/21 2030    ibuprofen (ADVIL) 800 MG tablet  3 times daily        11/01/21 2030    cyclobenzaprine (FLEXERIL) 10 MG tablet  2 times daily PRN        11/01/21 2030              Tonye Pearson, PA-C 11/01/21 2314    Fredia Sorrow, MD 11/03/21 1605

## 2021-11-01 NOTE — Discharge Instructions (Addendum)
Follow with Primary MD Lavone Orn, MD in 7 days   Get CBC, CMP, magnesium,-  checked next visit within 1 week by Primary MD, kindly follow-up on pending results for [facility] spotted fever, Lyme, ehrlichiosis, hepatitis.  Activity: As tolerated with Full fall precautions use walker/cane & assistance as needed  Disposition Home   Diet: Heart Healthy  Special Instructions: If you have smoked or chewed Tobacco  in the last 2 yrs please stop smoking, stop any regular Alcohol  and or any Recreational drug use.  On your next visit with your primary care physician please Get Medicines reviewed and adjusted.  Please request your Prim.MD to go over all Hospital Tests and Procedure/Radiological results at the follow up, please get all Hospital records sent to your Prim MD by signing hospital release before you go home.  If you experience worsening of your admission symptoms, develop shortness of breath, life threatening emergency, suicidal or homicidal thoughts you must seek medical attention immediately by calling 911 or calling your MD immediately  if symptoms less severe.  You Must read complete instructions/literature along with all the possible adverse reactions/side effects for all the Medicines you take and that have been prescribed to you. Take any new Medicines after you have completely understood and accpet all the possible adverse reactions/side effects.

## 2021-11-01 NOTE — ED Notes (Signed)
Patient placed on 2LNC at this time d/t desaturations while sleeping.

## 2021-11-01 NOTE — ED Notes (Addendum)
RT to room d/t desaturations. Patient laying flat in bed. No distress noted. Pt states no difficulty breathing. Patient instructed on taking deep breaths. SpO2 increased to 92%.

## 2021-11-01 NOTE — ED Provider Notes (Signed)
I provided a substantive portion of the care of this patient.  I personally performed the entirety of the history, exam, and medical decision making for this encounter.  EKG Interpretation  Date/Time:  Wednesday November 01 2021 13:09:10 EDT Ventricular Rate:  88 PR Interval:  158 QRS Duration: 82 QT Interval:  344 QTC Calculation: 416 R Axis:   74 Text Interpretation: Normal sinus rhythm Cannot rule out Anterior infarct , age undetermined Abnormal ECG When compared with ECG of 26-Mar-2018 16:51, PREVIOUS ECG IS PRESENT No significant change since last tracing Confirmed by Fredia Sorrow [phone]) on 11/01/2021 2:19:57 PM  Patient seen by me along with physician assistant.  Patient was on vacation in [place].  On the way back home on Sunday started to feel ill.  With body aches headache.  Sent in by primary care doctor for low white blood cell count.  Patient has a history of Crohn's disease.  Work-up mono negative COVID influenza negative headache function tests are abnormal with an AST of 420 ALT 386 alk phos 373 and total bili of 1.5 and direct bili of 0.8.  Normally malaria has an elevated indirect bili.  Lipase was normal.  Sodium is normal CO2 normal at 26 renal function normal white count at 2.1 but the differential only significant for absolute lymphocytes being low otherwise normal.  Platelets a little low at 133.  But not in the danger zone.  CT head negative CT neck negative.  Patient does not really have any meningeal signs next not really stiff.  Does have a headache.  Chest x-ray is negative.  Based on the abnormal liver function test will get CT abdomen patient is already had her gallbladder removed.  Differential does include hepatitis includes may be a little bit atypical presentation malaria currently farted is having problems with that.  Follow the CT scan we will discuss with infectious disease.  We will give fluids and treat headache any nausea.  We do not feel that this is  consistent with meningitis and certainly not consistent with bacterial meningitis.  Could be viral.  But with these abnormal LFTs I do feel that that is probably where the main symptomatic clue is.   Fredia Sorrow, MD 11/01/21 [phone]

## 2021-11-01 NOTE — ED Provider Notes (Signed)
  Physical Exam  BP (!) 106/53   Pulse 90   Temp 98.4 F (36.9 C) (Oral)   Resp 16   Ht '5\' 7"'$  (1.702 m)   Wt 84.8 kg   SpO2 94%   BMI 29.28 kg/m   Physical Exam  Procedures  Procedures  ED Course / MDM    Medical Decision Making Amount and/or Complexity of Data Reviewed Labs: ordered. Radiology: ordered.  Risk Prescription drug management.   Pt comes in with cc of weakness, malaise, fatigue, headaches. Labs are concerning for a viral syndrome.  LFTs elevated.  UA normal.  She has leukopenia, thrombocytopenia with platelet in the 130s and lymphopenia.  Patient was evaluated by APP in partnership with the morning physician.  The plan was for patient to be discharged if her work-up remains reassuring.  All her imaging is negative.  Family however wanted to discuss the findings with me.  They indicated that patient continues to feel quite well, weak and continues to have severe headaches.  One of the concerns Dr. Rogene Houston had mentioned to me was that patient could have a viral meningitis.  Family prefers that patient is at least observed overnight, which I think is a reasonable ask.  We will proceed with admission request. Headache medications prescribed.     Varney Biles, MD 11/01/21 2245

## 2021-11-01 NOTE — ED Triage Notes (Signed)
Patient here POV from Home.  Endorses returning from [place] this Past Sunday and shortly after returning the Patient experienced Sudden Onset of Fatigue, Chills, Back Pain and Neck Pain.   Seen by PCP and had collected Lab Specimens which resulted in Anemia and Low Platelet Count. No Confirmed Fevers but Patient endorses Chills and and Feeling Warm.   NAD Noted during Triage. A&Ox4. GCS 15. BIB Wheelchair.

## 2021-11-02 ENCOUNTER — Encounter (HOSPITAL_COMMUNITY): Payer: Self-pay | Admitting: Internal Medicine

## 2021-11-02 DIAGNOSIS — Z20822 Contact with and (suspected) exposure to covid-19: Secondary | ICD-10-CM | POA: Diagnosis not present

## 2021-11-02 DIAGNOSIS — R609 Edema, unspecified: Secondary | ICD-10-CM | POA: Diagnosis not present

## 2021-11-02 DIAGNOSIS — K5190 Ulcerative colitis, unspecified, without complications: Secondary | ICD-10-CM | POA: Diagnosis not present

## 2021-11-02 DIAGNOSIS — R5383 Other fatigue: Secondary | ICD-10-CM

## 2021-11-02 DIAGNOSIS — M791 Myalgia, unspecified site: Secondary | ICD-10-CM | POA: Diagnosis not present

## 2021-11-02 DIAGNOSIS — D6959 Other secondary thrombocytopenia: Secondary | ICD-10-CM | POA: Diagnosis not present

## 2021-11-02 DIAGNOSIS — Z9049 Acquired absence of other specified parts of digestive tract: Secondary | ICD-10-CM | POA: Diagnosis not present

## 2021-11-02 DIAGNOSIS — D61818 Other pancytopenia: Secondary | ICD-10-CM | POA: Diagnosis not present

## 2021-11-02 DIAGNOSIS — D352 Benign neoplasm of pituitary gland: Secondary | ICD-10-CM | POA: Diagnosis not present

## 2021-11-02 DIAGNOSIS — A938 Other specified arthropod-borne viral fevers: Secondary | ICD-10-CM | POA: Diagnosis not present

## 2021-11-02 DIAGNOSIS — F32A Depression, unspecified: Secondary | ICD-10-CM | POA: Diagnosis not present

## 2021-11-02 DIAGNOSIS — M47812 Spondylosis without myelopathy or radiculopathy, cervical region: Secondary | ICD-10-CM | POA: Diagnosis not present

## 2021-11-02 DIAGNOSIS — Z79899 Other long term (current) drug therapy: Secondary | ICD-10-CM | POA: Diagnosis not present

## 2021-11-02 DIAGNOSIS — Z87891 Personal history of nicotine dependence: Secondary | ICD-10-CM | POA: Diagnosis not present

## 2021-11-02 DIAGNOSIS — R7401 Elevation of levels of liver transaminase levels: Secondary | ICD-10-CM

## 2021-11-02 LAB — CBC WITH DIFFERENTIAL/PLATELET
Abs Immature Granulocytes: 0.01 10*3/uL (ref 0.00–0.07)
Basophils Absolute: 0 10*3/uL (ref 0.0–0.1)
Basophils Relative: 1 %
Eosinophils Absolute: 0 10*3/uL (ref 0.0–0.5)
Eosinophils Relative: 0 %
HCT: 35.8 % — ABNORMAL LOW (ref 36.0–46.0)
Hemoglobin: 11.5 g/dL — ABNORMAL LOW (ref 12.0–15.0)
Immature Granulocytes: 0 %
Lymphocytes Relative: 28 %
Lymphs Abs: 0.9 10*3/uL (ref 0.7–4.0)
MCH: 28.6 pg (ref 26.0–34.0)
MCHC: 32.1 g/dL (ref 30.0–36.0)
MCV: 89.1 fL (ref 80.0–100.0)
Monocytes Absolute: 0.4 10*3/uL (ref 0.1–1.0)
Monocytes Relative: 13 %
Neutro Abs: 2 10*3/uL (ref 1.7–7.7)
Neutrophils Relative %: 58 %
Platelets: 127 10*3/uL — ABNORMAL LOW (ref 150–400)
RBC: 4.02 MIL/uL (ref 3.87–5.11)
RDW: 13.6 % (ref 11.5–15.5)
WBC: 3.4 10*3/uL — ABNORMAL LOW (ref 4.0–10.5)
nRBC: 0 % (ref 0.0–0.2)

## 2021-11-02 LAB — COMPREHENSIVE METABOLIC PANEL
ALT: 279 U/L — ABNORMAL HIGH (ref 0–44)
AST: 214 U/L — ABNORMAL HIGH (ref 15–41)
Albumin: 2.8 g/dL — ABNORMAL LOW (ref 3.5–5.0)
Alkaline Phosphatase: 372 U/L — ABNORMAL HIGH (ref 38–126)
Anion gap: 7 (ref 5–15)
BUN: 7 mg/dL (ref 6–20)
CO2: 26 mmol/L (ref 22–32)
Calcium: 8.1 mg/dL — ABNORMAL LOW (ref 8.9–10.3)
Chloride: 104 mmol/L (ref 98–111)
Creatinine, Ser: 0.71 mg/dL (ref 0.44–1.00)
GFR, Estimated: >60 mL/min (ref >60)
Glucose, Bld: 106 mg/dL — ABNORMAL HIGH (ref 70–99)
Potassium: 3.8 mmol/L (ref 3.5–5.1)
Sodium: 137 mmol/L (ref 135–145)
Total Bilirubin: 0.9 mg/dL (ref 0.3–1.2)
Total Protein: 5.4 g/dL — ABNORMAL LOW (ref 6.5–8.1)

## 2021-11-02 LAB — RESPIRATORY PANEL BY PCR

## 2021-11-02 LAB — PROTIME-INR
INR: 1.1 (ref 0.8–1.2)
Prothrombin Time: 13.8 seconds (ref 11.4–15.2)

## 2021-11-02 LAB — SEDIMENTATION RATE: Sed Rate: 10 mm/hr (ref 0–22)

## 2021-11-02 LAB — HIV ANTIBODY (ROUTINE TESTING W REFLEX): HIV Screen 4th Generation wRfx: NONREACTIVE

## 2021-11-02 LAB — SAVE SMEAR(SSMR), FOR PROVIDER SLIDE REVIEW

## 2021-11-02 LAB — APTT: aPTT: 32 seconds (ref 24–36)

## 2021-11-02 LAB — C-REACTIVE PROTEIN: CRP: 9.9 mg/dL — ABNORMAL HIGH (ref <1.0)

## 2021-11-02 MED ORDER — ACETAMINOPHEN 325 MG PO TABS: 650.0000 mg | ORAL_TABLET | Freq: Four times a day (QID) | ORAL | Status: DC | PRN

## 2021-11-02 MED ORDER — ALBUTEROL SULFATE (2.5 MG/3ML) 0.083% IN NEBU: 2.5000 mg | INHALATION_SOLUTION | Freq: Four times a day (QID) | RESPIRATORY_TRACT | Status: DC | PRN

## 2021-11-02 MED ORDER — SODIUM CHLORIDE 0.9 % IV SOLN
INTRAVENOUS | Status: DC
Start: 2021-11-02 — End: 2021-11-04

## 2021-11-02 MED ORDER — ONDANSETRON HCL 4 MG/2ML IJ SOLN
4.0000 mg | Freq: Four times a day (QID) | INTRAMUSCULAR | Status: DC | PRN
  Administered 2021-11-02 – 2021-11-03 (×3): 4 mg via INTRAVENOUS
  Filled 2021-11-02 (×3): qty 2

## 2021-11-02 MED ORDER — ACETAMINOPHEN 650 MG RE SUPP: 650.0000 mg | Freq: Four times a day (QID) | RECTAL | Status: DC | PRN

## 2021-11-02 MED ORDER — OXYCODONE HCL 5 MG PO TABS
5.0000 mg | ORAL_TABLET | Freq: Four times a day (QID) | ORAL | Status: DC | PRN
  Administered 2021-11-02 – 2021-11-03 (×4): 5 mg via ORAL
  Filled 2021-11-02 (×5): qty 1

## 2021-11-02 MED ORDER — DULOXETINE HCL 30 MG PO CPEP
30.0000 mg | ORAL_CAPSULE | Freq: Two times a day (BID) | ORAL | Status: DC
  Administered 2021-11-02 – 2021-11-05 (×6): 30 mg via ORAL
  Filled 2021-11-02 (×6): qty 1

## 2021-11-02 MED ORDER — SODIUM CHLORIDE 0.9% FLUSH
3.0000 mL | Freq: Two times a day (BID) | INTRAVENOUS | Status: DC
Start: 2021-11-02 — End: 2021-11-05
  Administered 2021-11-02 – 2021-11-04 (×5): 3 mL via INTRAVENOUS

## 2021-11-02 MED ORDER — ONDANSETRON HCL 4 MG/2ML IJ SOLN
4.0000 mg | Freq: Once | INTRAMUSCULAR | Status: AC
  Administered 2021-11-02: 4 mg via INTRAVENOUS
  Filled 2021-11-02: qty 2

## 2021-11-02 MED ORDER — SACCHAROMYCES BOULARDII 250 MG PO CAPS
ORAL_CAPSULE | Freq: Every day | ORAL | Status: DC
  Administered 2021-11-03 – 2021-11-05 (×3): 250 mg via ORAL
  Filled 2021-11-02 (×3): qty 1

## 2021-11-02 MED ORDER — TRAZODONE HCL 50 MG PO TABS
50.0000 mg | ORAL_TABLET | Freq: Every evening | ORAL | Status: DC | PRN
  Administered 2021-11-02 – 2021-11-04 (×3): 50 mg via ORAL
  Filled 2021-11-02 (×3): qty 1

## 2021-11-02 MED ORDER — MORPHINE SULFATE (PF) 4 MG/ML IV SOLN
4.0000 mg | INTRAVENOUS | Status: DC | PRN
  Administered 2021-11-02 – 2021-11-03 (×5): 4 mg via INTRAVENOUS
  Filled 2021-11-02 (×6): qty 1

## 2021-11-02 MED ORDER — MESALAMINE 1.2 G PO TBEC
2.4000 g | DELAYED_RELEASE_TABLET | Freq: Every day | ORAL | Status: DC
  Administered 2021-11-03 – 2021-11-05 (×3): 2.4 g via ORAL
  Filled 2021-11-02 (×3): qty 2

## 2021-11-02 MED ORDER — ENOXAPARIN SODIUM 40 MG/0.4ML IJ SOSY
40.0000 mg | PREFILLED_SYRINGE | INTRAMUSCULAR | Status: DC
  Administered 2021-11-02 – 2021-11-04 (×3): 40 mg via SUBCUTANEOUS
  Filled 2021-11-02 (×3): qty 0.4

## 2021-11-02 NOTE — TOC Initial Note (Signed)
Transition of Care [facility]) - Initial/Assessment Note    Patient Details  Name: CARRIN VANNOSTRAND MRN: [id number] Date of Birth: 04-11-1961  Transition of Care [facility]) CM/SW Contact:    Ninfa Meeker, RN Phone Number: 11/02/2021, 4:15 PM  Clinical Narrative:                 Transition of Care Screening Note:  Transition of Care Department [facility]) has reviewed patient and no TOC needs have been identified at this time. We will continue to monitor patient advancement through Interdisciplinary progressions. If new patient transition needs arise, please place a consult.         Patient Goals and CMS Choice        Expected Discharge Plan and Services                                                Prior Living Arrangements/Services                       Activities of Daily Living      Permission Sought/Granted                  Emotional Assessment              Admission diagnosis:  Fatigue [R53.83] Elevated LFTs [R79.89] Other fatigue [R53.83] Acute nonintractable headache, unspecified headache type [R51.9] Patient Active Problem List   Diagnosis Date Noted   Fatigue 11/01/2021   Changing skin lesion 10/08/2019   CAD (coronary artery disease) 06/26/2018   DOE (dyspnea on exertion) 06/26/2018   Bicuspid aortic valve 05/27/2018   Chest discomfort 03/27/2018   Laryngopharyngeal reflux (LPR) 08/20/2017   Ulcerative colitis ([place]) 09/21/2015   Overweight (BMI 25.0-29.9) 09/21/2015   Cellulitis and abscess 09/21/2015   Lymphocytic colitis 10/17/2011   Depression, postpartum 10/17/2011   PCP:  Lavone Orn, MD Pharmacy:   [place] [address] Phone: [phone] Fax: [phone]  [place], [place] [address] Phone: [phone] Fax: [phone]  [facility] ([place], [facility] [address] [place] [address] [place] [address] Phone: [phone] Fax: [place] [facility] [address] [place] [address] Phone: [phone] Fax: [phone]  [id number] [id number]: [phone]: [phone]    Social Determinants of Health (SDOH) Interventions    Readmission Risk Interventions     No data to display

## 2021-11-02 NOTE — H&P (Signed)
History and Physical    Patient: [name] YHO:[id number] DOB: June 22, 1960 DOA: 11/01/2021 DOS: the patient was seen and examined on 11/02/2021 PCP: Lavone Orn, MD  Patient coming from: Transfer from [place] Complaint:  Chief Complaint  Patient presents with   Fatigue   HPI: [name] is a 61 y.o. female with medical history significant of Ulcerative colitis managed well on mesalamine and history of pituitary adenoma followed by Dr. Chalmers Cater presents with complaints four day history of severe fatigue.  She had traveled back to [place] 4 days ago and was feeling fine initially.  However, by 6:30 pm she reported being on the couch unable to move.  Following day she reported having a severe headache at the top of her head radiating back with associated pain in her neck, back, and sides.  Associated symptoms included photosensitivity, nausea, and red color to urine.  She has an day usually go to every week and dogs reports that she had not noticed any tick bite.  While in [place] they had not gotten into the water, but were near the [place] area which had multiple cases of malaria reported recently.  Patient had gone to her PCP due to her symptoms and blood work was obtained.  Records note labs showed CRP 115, ESR 5, WBC was 3.7, platelet count 147, alkaline phosphatase 141, ALT 136, and AST 139.  Patient was advised to come to the emergency department for further evaluation.  Upon admission into the emergency department patient was noted to be febrile up to 100.1 F, pulse 69-107, respirations 10-22, blood pressures as low as 67/43-135/71, and O2 saturations transiently as low as 84% temporarily requiring 2 L of nasal oxygen maintain O2 sats.  Labs from 7/12 significant for WBC 2.1, platelets 133, glucose 122 phosphatase 373, AST 420, ALT 386, lipase 22, and total bilirubin 1.5.  CT scan of the abdomen pelvis noted no acute or focal lesion to explain patient's  symptoms with mild periportal edema present.  Urinalysis noted small hemoglobin, trace leukocytes, and specific gravity 1.038.  Hepatitis panel was negative.patient had been given 2 L of IV fluids, Reglan, Zofran, and morphine for pain.  Review of Systems: As mentioned in the history of present illness. All other systems reviewed and are negative. History reviewed. No pertinent past medical history. Past Surgical History:  Procedure Laterality Date   APPENDECTOMY     CHOLECYSTECTOMY     ENDOMETRIAL ABLATION     LAPAROTOMY     laproscopic kne     TONSILLECTOMY     Social History:  reports that she has never smoked. She has never used smokeless tobacco. She reports current alcohol use. She reports that she does not use drugs.  No Known Allergies  Family History  Problem Relation Age of Onset   Goiter Mother    Irregular heart beat Mother        Afibrillation   Heart attack Mother    Heart failure Father    Thyroid disease Father    Irregular heart beat Father        Afibrillation   Heart disease Father    Diabetes Brother     Prior to Admission medications   Medication Sig Start Date End Date Taking? Authorizing Provider  cyclobenzaprine (FLEXERIL) 10 MG tablet Take 1 tablet (10 mg total) by mouth 2 (two) times daily as needed for muscle spasms. 11/01/21  Yes Kathe Becton R, PA-C  DULoxetine (CYMBALTA) 30 MG  capsule Take 1 capsule by mouth twice a day 10/20/21  Yes   ibuprofen (ADVIL) 800 MG tablet Take 1 tablet (800 mg total) by mouth 3 (three) times daily. 11/01/21  Yes Tonye Pearson, PA-C  mesalamine (LIALDA) 1.2 g EC tablet Take 2 tablets by mouth daily with breakfast. 10/26/09  Yes [provider]  ondansetron (ZOFRAN) 4 MG tablet Take 1 tablet (4 mg total) by mouth every 6 (six) hours. 11/01/21  Yes Tonye Pearson, PA-C  traZODone (DESYREL) 50 MG tablet Take 1-3 tablets by mouth nightly at bedtime as needed 10/20/21  Yes   aspirin EC 81 MG tablet Take 1 tablet (81  mg total) by mouth daily. Patient not taking: Reported on 11/13/2019 06/26/18   Revankar, Reita Cliche, MD  Phentermine HCl [facility]) 8 MG TABS Take 1 tablet by mouth 30 minutes before a meal once daily Patient not taking: Reported on 11/02/2021 08/23/21     Probiotic Product (PROBIOTIC DAILY PO) Take 1 tablet by mouth daily.    [provider]  terbinafine (LAMISIL) 250 MG tablet Take 1 tablet (250 mg total) by mouth daily. After finishing 90 days of the medication you may stop taking. Patient not taking: Reported on 11/02/2021 12/16/19   Bronson Ing, DPM  terconazole (TERAZOL 3) 0.8 % vaginal cream Place 1 applicator vaginally as needed. Patient not taking: Reported on 11/02/2021 01/13/10   [provider]  triamcinolone (KENALOG) 0.1 % paste Apply to affected area 4 times a day after meals and at bedtime Patient not taking: Reported on 11/02/2021 04/12/21       Physical Exam: Vitals:   11/02/21 1115 11/02/21 1200 11/02/21 1300 11/02/21 1348  BP: 113/64 106/67  101/63  Pulse: 74 71  71  Resp: '10 12  12  ' Temp:   97.9 F (36.6 C) 98.4 F (36.9 C)  TempSrc:   Axillary Oral  SpO2: 100% 99%  94%  Weight:      Height:       Constitutional: Middle-aged female who appears acutely ill and  Eyes: PERRL, lids and conjunctivae normal ENMT: Mucous membranes are dry.  Neck: normal, patient able to move neck side to side although complains of discomfort in doing so Respiratory: clear to auscultation bilaterally, no wheezing, no crackles. Normal respiratory effort. No accessory muscle use.  Cardiovascular: Regular rate and rhythm, no murmurs.  No extremity edema. 2+ pedal pulses.   Abdomen: No significant abdominal tenderness appreciated.  Bowel sounds appreciated in all 4 quadrants. Musculoskeletal: no clubbing / cyanosis. No joint deformity upper and lower extremities.  Skin: no rashes, lesions, ulcers. No induration Neurologic: CN 2-12 grossly intact. Strength 5/5 in all 4.   Psychiatric: Normal judgment and insight. Alert and oriented x 3. Normal mood.   Data Reviewed:  EKG revealed normal sinus rhythm at 88 bpm  Assessment and Plan: Fatigue with myalgias Patient presents with a 4-day history of severe fatigue and myalgias.  Work-up had been done which included negative mononucleosis screen.  Patient was worried about malaria as there were 6 cases of it recently in [place] that was close to where the patient was staying. Question a viral syndrome. -Admit to a telemetry -Follow-up pending studies -Oxycodone/morphine IV as needed for pain -Normal saline IV fluids at 100 mL/h  Pancytopenia Acute.  Initial labs reveal WBC 2.1 and platelet count 133. -Check blood cultures -Check peripheral smear -Check repeat CBC  Transaminitis and hyperbilirubinemia Acute.  Hepatitis panel was noted to be  negative.  CT scan of the abdomen pelvis noted no acute abnormality as the cause of patient's pain with mild periportal edema. Labs from 7/12 revealed alkaline phosphatase 373, AST 20, ALT 386, and total bilirubin 1.5. -Continue to monitor  Ulcerative colitis Stable -Continue mesalamine   Depression -continue cymbalta  DVT prophylaxis: Lovenox Advance Care Planning:   Code Status: Full Code   Consults: ID   Family Communication: husband updated   Severity of Illness: The appropriate patient status for this patient is OBSERVATION. Observation status is judged to be reasonable and necessary in order to provide the required intensity of service to ensure the patient's safety. The patient's presenting symptoms, physical exam findings, and initial radiographic and laboratory data in the context of their medical condition is felt to place them at decreased risk for further clinical deterioration. Furthermore, it is anticipated that the patient will be medically stable for discharge from the hospital within 2 midnights of admission.   Author: Norval Morton,  MD 11/02/2021 3:04 PM  For on call review [email or web address].

## 2021-11-02 NOTE — ED Notes (Signed)
Patient woke up with need to go to the bathroom to void. Upon sitting up patient complained the pain in the back of her head/neck was bothering her again rated at 9/10. Patient complained of nausea, actively vomited small amount of emesis. Assisted to bathroom to void. A&O x 4. Neuro check WNL.

## 2021-11-03 DIAGNOSIS — Z20822 Contact with and (suspected) exposure to covid-19: Secondary | ICD-10-CM | POA: Diagnosis present

## 2021-11-03 DIAGNOSIS — R609 Edema, unspecified: Secondary | ICD-10-CM | POA: Diagnosis present

## 2021-11-03 DIAGNOSIS — D6959 Other secondary thrombocytopenia: Secondary | ICD-10-CM | POA: Diagnosis present

## 2021-11-03 DIAGNOSIS — K5190 Ulcerative colitis, unspecified, without complications: Secondary | ICD-10-CM | POA: Diagnosis present

## 2021-11-03 DIAGNOSIS — R5383 Other fatigue: Secondary | ICD-10-CM | POA: Diagnosis present

## 2021-11-03 DIAGNOSIS — Z9049 Acquired absence of other specified parts of digestive tract: Secondary | ICD-10-CM | POA: Diagnosis not present

## 2021-11-03 DIAGNOSIS — D61818 Other pancytopenia: Secondary | ICD-10-CM | POA: Diagnosis present

## 2021-11-03 DIAGNOSIS — R519 Headache, unspecified: Secondary | ICD-10-CM

## 2021-11-03 DIAGNOSIS — R7989 Other specified abnormal findings of blood chemistry: Secondary | ICD-10-CM

## 2021-11-03 DIAGNOSIS — Z79899 Other long term (current) drug therapy: Secondary | ICD-10-CM | POA: Diagnosis not present

## 2021-11-03 DIAGNOSIS — D352 Benign neoplasm of pituitary gland: Secondary | ICD-10-CM | POA: Diagnosis present

## 2021-11-03 DIAGNOSIS — F32A Depression, unspecified: Secondary | ICD-10-CM | POA: Diagnosis present

## 2021-11-03 DIAGNOSIS — A938 Other specified arthropod-borne viral fevers: Secondary | ICD-10-CM | POA: Diagnosis present

## 2021-11-03 DIAGNOSIS — M47812 Spondylosis without myelopathy or radiculopathy, cervical region: Secondary | ICD-10-CM | POA: Diagnosis present

## 2021-11-03 DIAGNOSIS — Z87891 Personal history of nicotine dependence: Secondary | ICD-10-CM | POA: Diagnosis not present

## 2021-11-03 LAB — CBC
HCT: 32.8 % — ABNORMAL LOW (ref 36.0–46.0)
Hemoglobin: 10.7 g/dL — ABNORMAL LOW (ref 12.0–15.0)
MCH: 28.8 pg (ref 26.0–34.0)
MCHC: 32.6 g/dL (ref 30.0–36.0)
MCV: 88.4 fL (ref 80.0–100.0)
Platelets: 123 10*3/uL — ABNORMAL LOW (ref 150–400)
RBC: 3.71 MIL/uL — ABNORMAL LOW (ref 3.87–5.11)
RDW: 13.6 % (ref 11.5–15.5)
WBC: 3.4 10*3/uL — ABNORMAL LOW (ref 4.0–10.5)
nRBC: 0 % (ref 0.0–0.2)

## 2021-11-03 LAB — COMPREHENSIVE METABOLIC PANEL
ALT: 248 U/L — ABNORMAL HIGH (ref 0–44)
AST: 172 U/L — ABNORMAL HIGH (ref 15–41)
Albumin: 2.8 g/dL — ABNORMAL LOW (ref 3.5–5.0)
Alkaline Phosphatase: 391 U/L — ABNORMAL HIGH (ref 38–126)
Anion gap: 11 (ref 5–15)
BUN: 8 mg/dL (ref 6–20)
CO2: 23 mmol/L (ref 22–32)
Calcium: 8.1 mg/dL — ABNORMAL LOW (ref 8.9–10.3)
Chloride: 104 mmol/L (ref 98–111)
Creatinine, Ser: 0.59 mg/dL (ref 0.44–1.00)
GFR, Estimated: >60 mL/min (ref >60)
Glucose, Bld: 122 mg/dL — ABNORMAL HIGH (ref 70–99)
Potassium: 3.5 mmol/L (ref 3.5–5.1)
Sodium: 138 mmol/L (ref 135–145)
Total Bilirubin: 0.9 mg/dL (ref 0.3–1.2)
Total Protein: 5.3 g/dL — ABNORMAL LOW (ref 6.5–8.1)

## 2021-11-03 LAB — RPR: RPR Ser Ql: NONREACTIVE

## 2021-11-03 MED ORDER — SODIUM CHLORIDE 0.9 % IV SOLN
100.0000 mg | Freq: Two times a day (BID) | INTRAVENOUS | Status: DC
  Administered 2021-11-03 – 2021-11-04 (×4): 100 mg via INTRAVENOUS
  Filled 2021-11-03 (×5): qty 100

## 2021-11-03 MED ORDER — PROCHLORPERAZINE EDISYLATE 10 MG/2ML IJ SOLN
5.0000 mg | Freq: Once | INTRAMUSCULAR | Status: AC
  Administered 2021-11-03: 5 mg via INTRAVENOUS
  Filled 2021-11-03: qty 2

## 2021-11-03 MED ORDER — PROCHLORPERAZINE EDISYLATE 10 MG/2ML IJ SOLN
5.0000 mg | Freq: Four times a day (QID) | INTRAMUSCULAR | Status: DC | PRN
  Administered 2021-11-03 – 2021-11-04 (×3): 5 mg via INTRAVENOUS
  Filled 2021-11-03 (×3): qty 2

## 2021-11-03 NOTE — Progress Notes (Addendum)
PROGRESS NOTE        PATIENT DETAILS Name: [name] Age: 61 y.o. Sex: female Date of Birth: Mar 09, 1961 Admit Date: 11/01/2021 Admitting Physician Yvette Memos, DO SWF:UXNATFT, Yvette Reichmann, MD  Brief Summary: Patient is a 61 y.o.  female with history of UC-on mesalamine, pituitary adenoma-who presented with 4-day history of fever, myalgias, headache-was found to have transaminitis, leukopenia/thrombocytopenia-and subsequently admitted to the hospitalist service.  She recently traveled to [place] also to her cabin in [place].  Significant events: 7/12>> admit to [facility]-fever/myalgias/headache/transaminitis/leukopenia/thrombocytopenia  Significant studies: 7/12>> CT head: No acute intracranial abnormality 7/12 >>CT C-spine: No acute intracranial abnormality 7/12>> CT abdomen/pelvis: No acute/focal lesions to explain patient's abdominal pain. 7/12>> CXR: No pneumonia 7/12>> UA: No evidence of UTI  Significant microbiology data: 7/12>> acute hepatitis serology: Negative 7/12>> mononucleosis screen: Negative 7/13>> HIV: Nonreactive 7/13>> respiratory virus panel: Negative 7/13>> blood culture: No growth 7/13>> Plasmodium species PCR: Pending 7/14>> RMSF panel: Pending 7/32>> Ehrlichia antibody panel: Pending 7/14>> Lyme disease serology: Pending  Procedures:   Consults: ID  Subjective: Continues to have diffuse myalgias/headaches.  Afebrile overnight.  Objective: Vitals: Blood pressure 117/80, pulse 67, temperature 98.6 F (37 C), temperature source Oral, resp. rate 11, height '5\' 7"'$  (1.702 m), weight 84.8 kg, SpO2 95 %.   Exam: Gen Exam:Alert awake-not in any distress HEENT:atraumatic, normocephalic.  Neck supple.  Kernig's sign negative. Chest: B/L clear to auscultation anteriorly CVS:S1S2 regular Abdomen:soft non tender, non distended Extremities:no edema Neurology: Non focal Skin: no rash  Pertinent Labs/Radiology:    Latest Ref Rng  & Units 11/03/2021   12:24 AM 11/02/2021    4:09 PM 11/01/2021    1:03 PM  CBC  WBC 4.0 - 10.5 K/uL 3.4  3.4  2.1   Hemoglobin 12.0 - 15.0 g/dL 10.7  11.5  13.7   Hematocrit 36.0 - 46.0 % 32.8  35.8  42.3   Platelets 150 - 400 K/uL 123  127  133     Lab Results  Component Value Date   NA 138 11/03/2021   K 3.5 11/03/2021   CL 104 11/03/2021   CO2 23 11/03/2021      Assessment/Plan: Fever/fatigue/myalgias/headache with transaminitis/thrombocytopenia/leukopenia: Continues to feel poorly-but otherwise stable nontoxic-appearing.  Her overall etiology is highly suspicious for tickborne illness (walks her dog in the woods in her cabin in [place]- has not noticed a tick bite though)-have ordered RMSF/Ehrlichia antibody panel-starting IV doxycycline.  Will assess response to treatment-ID will follow later today and provide further recommendations.  Malaria felt to be unlikely-however Plasmodium PCR pending.  Doubt patient has meningitis given negative meningeal signs.  Transaminitis: Likely related to tickborne illness-improving-follow.  Leukopenia/thrombocytopenia: Likely related to tickborne illness-improving-follow  Ulcerative colitis: Stable-continue mesalamine  Depression: Continue Cymbalta  BMI: Estimated body mass index is 29.28 kg/m as calculated from the following:   Height as of this encounter: '5\' 7"'$  (1.702 m).   Weight as of this encounter: 84.8 kg.   Code status:   Code Status: Full Code   DVT Prophylaxis: enoxaparin (LOVENOX) injection 40 mg Start: 11/02/21 1800   Family Communication: Spouse at bedside   Disposition Plan: Status is: Observation The patient will require care spanning > 2 midnights and should be moved to inpatient because: Severe myalgias/headaches-starting IV doxycycline-not yet stable for discharge.   Planned Discharge Destination:Home   Diet: Diet Order  Diet regular Room service appropriate? Yes; Fluid consistency: Thin  Diet  effective now                     Antimicrobial agents: Anti-infectives (From admission, onward)    Start     Dose/Rate Route Frequency Ordered Stop   11/03/21 0851  doxycycline (VIBRAMYCIN) 100 mg in sodium chloride 0.9 % 250 mL IVPB        100 mg 125 mL/hr over 120 Minutes Intravenous Every 12 hours 11/03/21 0851          MEDICATIONS: Scheduled Meds:  DULoxetine  30 mg Oral BID   enoxaparin (LOVENOX) injection  40 mg Subcutaneous Q24H   mesalamine  2.4 g Oral Q breakfast   saccharomyces boulardii   Oral Daily   sodium chloride flush  3 mL Intravenous Q12H   Continuous Infusions:  sodium chloride 100 mL/hr at 11/03/21 0303   doxycycline (VIBRAMYCIN) IV 100 mg (11/03/21 1007)   PRN Meds:.acetaminophen **OR** acetaminophen, albuterol, morphine injection, ondansetron (ZOFRAN) IV, oxyCODONE, traZODone   I have personally reviewed following labs and imaging studies  LABORATORY DATA: CBC: Recent Labs  Lab 11/01/21 1303 11/02/21 1609 11/03/21 0024  WBC 2.1* 3.4* 3.4*  NEUTROABS 1.7 2.0  --   HGB 13.7 11.5* 10.7*  HCT 42.3 35.8* 32.8*  MCV 89.6 89.1 88.4  PLT 133* 127* 123*    Basic Metabolic Panel: Recent Labs  Lab 11/01/21 1303 11/02/21 1609 11/03/21 0024  NA 138 137 138  K 3.7 3.8 3.5  CL 100 104 104  CO2 '26 26 23  '$ GLUCOSE 122* 106* 122*  BUN '12 7 8  '$ CREATININE 0.64 0.71 0.59  CALCIUM 9.5 8.1* 8.1*    GFR: Estimated Creatinine Clearance: 84.7 mL/min (by C-G formula based on SCr of 0.59 mg/dL).  Liver Function Tests: Recent Labs  Lab 11/01/21 1403 11/02/21 1609 11/03/21 0024  AST 420* 214* 172*  ALT 386* 279* 248*  ALKPHOS 373* 372* 391*  BILITOT 1.5* 0.9 0.9  PROT 6.9 5.4* 5.3*  ALBUMIN 4.4 2.8* 2.8*   Recent Labs  Lab 11/01/21 1403  LIPASE 22   No results for input(s): "AMMONIA" in the last 168 hours.  Coagulation Profile: Recent Labs  Lab 11/02/21 1609  INR 1.1    Cardiac Enzymes: No results for input(s): "CKTOTAL",  "CKMB", "CKMBINDEX", "TROPONINI" in the last 168 hours.  BNP (last 3 results) No results for input(s): "PROBNP" in the last 8760 hours.  Lipid Profile: No results for input(s): "CHOL", "HDL", "LDLCALC", "TRIG", "CHOLHDL", "LDLDIRECT" in the last 72 hours.  Thyroid Function Tests: No results for input(s): "TSH", "T4TOTAL", "FREET4", "T3FREE", "THYROIDAB" in the last 72 hours.  Anemia Panel: No results for input(s): "VITAMINB12", "FOLATE", "FERRITIN", "TIBC", "IRON", "RETICCTPCT" in the last 72 hours.  Urine analysis:    Component Value Date/Time   COLORURINE YELLOW 11/01/2021 1315   APPEARANCEUR CLEAR 11/01/2021 1315   LABSPEC 1.038 (H) 11/01/2021 1315   PHURINE 6.5 11/01/2021 1315   GLUCOSEU NEGATIVE 11/01/2021 1315   HGBUR NEGATIVE 11/01/2021 1315   BILIRUBINUR SMALL (A) 11/01/2021 1315   KETONESUR NEGATIVE 11/01/2021 1315   PROTEINUR 100 (A) 11/01/2021 1315   NITRITE NEGATIVE 11/01/2021 1315   LEUKOCYTESUR TRACE (A) 11/01/2021 1315    Sepsis Labs: Lactic Acid, Venous No results found for: "LATICACIDVEN"  MICROBIOLOGY: Recent Results (from the past 240 hour(s))  Resp Panel by RT-PCR (Flu A&B, Covid) Anterior Nasal Swab     Status: None   Collection  Time: 11/01/21  2:10 PM   Specimen: Anterior Nasal Swab  Result Value Ref Range Status   SARS Coronavirus 2 by RT PCR NEGATIVE NEGATIVE Final    Comment: (NOTE) SARS-CoV-2 target nucleic acids are NOT DETECTED.  The SARS-CoV-2 RNA is generally detectable in upper respiratory specimens during the acute phase of infection. The lowest concentration of SARS-CoV-2 viral copies this assay can detect is 138 copies/mL. A negative result does not preclude SARS-Cov-2 infection and should not be used as the sole basis for treatment or other patient management decisions. A negative result may occur with  improper specimen collection/handling, submission of specimen other than nasopharyngeal swab, presence of viral mutation(s) within  the areas targeted by this assay, and inadequate number of viral copies(<138 copies/mL). A negative result must be combined with clinical observations, patient history, and epidemiological information. The expected result is Negative.  Fact Sheet for Patients:  EntrepreneurPulse.com.au  Fact Sheet for Healthcare Providers:  IncredibleEmployment.be  This test is no t yet approved or cleared by the [place] FDA and  has been authorized for detection and/or diagnosis of SARS-CoV-2 by FDA under an Emergency Use Authorization (EUA). This EUA will remain  in effect (meaning this test can be used) for the duration of the COVID-19 declaration under Section 564(b)(1) of the Act, 21 U.S.C.section 360bbb-3(b)(1), unless the authorization is terminated  or revoked sooner.       Influenza A by PCR NEGATIVE NEGATIVE Final   Influenza B by PCR NEGATIVE NEGATIVE Final    Comment: (NOTE) The Xpert Xpress SARS-CoV-2/FLU/RSV plus assay is intended as an aid in the diagnosis of influenza from Nasopharyngeal swab specimens and should not be used as a sole basis for treatment. Nasal washings and aspirates are unacceptable for Xpert Xpress SARS-CoV-2/FLU/RSV testing.  Fact Sheet for Patients: EntrepreneurPulse.com.au  Fact Sheet for Healthcare Providers: IncredibleEmployment.be  This test is not yet approved or cleared by the [place] FDA and has been authorized for detection and/or diagnosis of SARS-CoV-2 by FDA under an Emergency Use Authorization (EUA). This EUA will remain in effect (meaning this test can be used) for the duration of the COVID-19 declaration under Section 564(b)(1) of the Act, 21 U.S.C. section 360bbb-3(b)(1), unless the authorization is terminated or revoked.  Performed at [facility], [address] [place], [place] [address]   Respiratory (~20 pathogens) panel by PCR      Status: None   Collection Time: 11/02/21  4:11 PM   Specimen: Nasopharyngeal Swab; Respiratory  Result Value Ref Range Status   Adenovirus NOT DETECTED NOT DETECTED Final   Coronavirus 229E NOT DETECTED NOT DETECTED Final    Comment: (NOTE) The Coronavirus on the Respiratory Panel, DOES NOT test for the novel  Coronavirus (2019 nCoV)    Coronavirus HKU1 NOT DETECTED NOT DETECTED Final   Coronavirus NL63 NOT DETECTED NOT DETECTED Final   Coronavirus OC43 NOT DETECTED NOT DETECTED Final   Metapneumovirus NOT DETECTED NOT DETECTED Final   Rhinovirus / Enterovirus NOT DETECTED NOT DETECTED Final   Influenza A NOT DETECTED NOT DETECTED Final   Influenza B NOT DETECTED NOT DETECTED Final   Parainfluenza Virus 1 NOT DETECTED NOT DETECTED Final   Parainfluenza Virus 2 NOT DETECTED NOT DETECTED Final   Parainfluenza Virus 3 NOT DETECTED NOT DETECTED Final   Parainfluenza Virus 4 NOT DETECTED NOT DETECTED Final   Respiratory Syncytial Virus NOT DETECTED NOT DETECTED Final   Bordetella pertussis NOT DETECTED NOT DETECTED Final   Bordetella Parapertussis  NOT DETECTED NOT DETECTED Final   Chlamydophila pneumoniae NOT DETECTED NOT DETECTED Final   Mycoplasma pneumoniae NOT DETECTED NOT DETECTED Final    Comment: Performed at [facility], [place] [address], [place], [place] [address]  Culture, blood (Routine X 2) w Reflex to ID Panel     Status: None (Preliminary result)   Collection Time: 11/02/21  4:36 PM   Specimen: BLOOD  Result Value Ref Range Status   Specimen Description BLOOD RIGHT ANTECUBITAL  Final   Special Requests   Final    BOTTLES DRAWN AEROBIC AND ANAEROBIC Blood Culture adequate volume   Culture   Final    NO GROWTH < 12 HOURS Performed at [facility], [place] [address] [place], [place] [address]    Report Status PENDING  Incomplete  Culture, blood (Routine X 2) w Reflex to ID Panel     Status: None (Preliminary result)   Collection Time: 11/02/21  4:36 PM    Specimen: BLOOD  Result Value Ref Range Status   Specimen Description BLOOD RIGHT ANTECUBITAL  Final   Special Requests   Final    BOTTLES DRAWN AEROBIC AND ANAEROBIC Blood Culture results may not be optimal due to an excessive volume of blood received in culture bottles   Culture   Final    NO GROWTH < 12 HOURS Performed at [facility], [place] [address], [place], [place] [address]    Report Status PENDING  Incomplete    RADIOLOGY STUDIES/RESULTS: CT ABDOMEN PELVIS W CONTRAST  Result Date: 11/01/2021 CLINICAL DATA:  Abdominal pain, acute, nonlocalized.  Elevated LFTs. EXAM: CT ABDOMEN AND PELVIS WITH CONTRAST TECHNIQUE: Multidetector CT imaging of the abdomen and pelvis was performed using the standard protocol following bolus administration of intravenous contrast. RADIATION DOSE REDUCTION: This exam was performed according to the departmental dose-optimization program which includes automated exposure control, adjustment of the mA and/or kV according to patient size and/or use of iterative reconstruction technique. CONTRAST:  65m OMNIPAQUE IOHEXOL 300 MG/ML  SOLN COMPARISON:  CT of the abdomen pelvis 08/15/2012. FINDINGS: Lower chest: The lung bases are clear without focal nodule, mass, mild dependent atelectasis is present. No significant airspace consolidation present. Heart size is normal. Hepatobiliary: Mild periportal edema is present. No discrete lesions are present. The common bile duct is within normal limits for age following cholecystectomy. Pancreas: Slight dilation the pancreatic duct is stable. No obstructing lesion is present. Spleen: Normal in size without focal abnormality. Adrenals/Urinary Tract: Adrenal glands are unremarkable. Kidneys are normal, without renal calculi, focal lesion, or hydronephrosis. An 8 mm simple cyst is noted in the right kidney. Recommend a follow-up bladder is unremarkable. Stomach/Bowel: Stomach and duodenum are within normal limits. Small bowel is  unremarkable. The terminal ileum is within normal limits. Appendix is surgically absent. The ascending and transverse colon are within normal limits. Descending and sigmoid colon are normal. Vascular/Lymphatic: No significant vascular findings are present. No enlarged abdominal or pelvic lymph nodes. Reproductive: Uterus and bilateral adnexa are unremarkable. Clips are noted on the flow being tubes. Other: No abdominal wall hernia or abnormality. No abdominopelvic ascites. Musculoskeletal: Grade 1 degenerative anterolisthesis is present at L4-5. Vertebral body heights and alignment are otherwise normal. No focal osseous lesions are present. IMPRESSION: 1. No acute or focal lesion to explain the patient's abdominal pain. 2. Mild periportal edema is nonspecific, but can be seen in the setting of acute hepatitis. 3. Cholecystectomy and appendectomy. 4. Grade 1 degenerative anterolisthesis at  L4-5. Electronically Signed   By: San Morelle M.D.   On: 11/01/2021 18:02   CT Cervical Spine Wo Contrast  Result Date: 11/01/2021 CLINICAL DATA:  Neck pain, infection suspected, no prior imaging. Shortly after returning from [place], patient has been experiencing chills, neck pain, fatigue, headache and nausea EXAM: CT CERVICAL SPINE WITHOUT CONTRAST TECHNIQUE: Multidetector CT imaging of the cervical spine was performed without intravenous contrast. Multiplanar CT image reconstructions were also generated. RADIATION DOSE REDUCTION: This exam was performed according to the departmental dose-optimization program which includes automated exposure control, adjustment of the mA and/or kV according to patient size and/or use of iterative reconstruction technique. COMPARISON:  None Available. FINDINGS: Alignment: There is minimal reversal of the normal lordotic curvature seen. Minimal anterolisthesis of C2 over C3. Skull base and vertebrae: No acute fracture. No primary bone lesion or focal pathologic process. Bilateral  mastoid air cells and middle and external ear canals have a normal appearance. Mild cervical spondylosis. Soft tissues and spinal canal: No prevertebral fluid or swelling. No visible canal hematoma. Disc levels:  Disc levels are well-maintained. Upper chest: Negative. Other: None. IMPRESSION: Minimal reversal of the normal lordotic curvature of the cervical spine, which may be due to pain, spasm or position. Mild cervical spondylosis. Electronically Signed   By: Frazier Richards M.D.   On: 11/01/2021 15:04   CT Head Wo Contrast  Result Date: 11/01/2021 CLINICAL DATA:  Headache, sudden, severe EXAM: CT HEAD WITHOUT CONTRAST TECHNIQUE: Contiguous axial images were obtained from the base of the skull through the vertex without intravenous contrast. RADIATION DOSE REDUCTION: This exam was performed according to the departmental dose-optimization program which includes automated exposure control, adjustment of the mA and/or kV according to patient size and/or use of iterative reconstruction technique. COMPARISON:  None Available. FINDINGS: Brain: No evidence of acute infarction, hemorrhage, hydrocephalus, extra-axial collection or visible mass lesion/mass effect. 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 Vascular: No hyperdense vessel identified. Skull: No acute fracture. Sinuses/Orbits: Visualized sinuses are clear. No acute orbital findings. Other: No mastoid effusions. IMPRESSION: No evidence of acute intracranial abnormality. Electronically Signed   By: Margaretha Sheffield M.D.   On: 11/01/2021 14:56   DG Chest Portable 1 View  Result Date: 11/01/2021 CLINICAL DATA:  Shortness of breath EXAM: PORTABLE CHEST 1 VIEW COMPARISON:  December 2018 FINDINGS: The heart size and mediastinal contours are within normal limits. Both lungs are clear. No pleural effusion or pneumothorax. The visualized skeletal structures are unremarkable. IMPRESSION: No acute process in the chest. Electronically Signed   By:  Macy Mis M.D.   On: 11/01/2021 14:54     LOS: 0 days   Oren Binet, MD  [facility]    To contact the attending provider between 7A-7P or the covering provider during after hours 7P-7A, please log into the web site [email or web address] and access using universal [place] password for that web site. If you do not have the password, please call the hospital operator.  11/03/2021, 11:25 AM

## 2021-11-03 NOTE — Plan of Care (Signed)

## 2021-11-03 NOTE — Consult Note (Signed)
[facility] for Infectious Disease    Date of Admission:  11/01/2021   Total days of inpatient antibiotics 0        Reason for Consult: Concern for malaria    Principal Problem:   Fatigue Active Problems:   Ulcerative colitis ([place])   Myalgia   Pancytopenia ([place])   Transaminitis   Hyperbilirubinemia   Assessment: 89 YF with ulcerative colitis on mesalamine admitted with fever/myalgia/ Ha and abnormal Labs.   #Fever/myalgia/HA #Concern for tick born etiology(likely HME/HGA) #Elevated LFTs-trending down #Thrombocytopenia #Leukopenia Presentation: On Sunday after returning from [place] pt had myalgia and fatigue, next day developed fever from top of head to back of neck with+ photophobia/Nausea. No rash/GI symptoms. 420/386/373 AST/ALT/ALP, wbc 2.1K, plt 133, RPR negative, HIV negative. CT showed periprotal edema c/f acute hepatitis. Hep panel negative. CT C spine  and CTH showed no acute amiabilities. Travel: Pt traveled to [place] in [place] to buy a house which is on [employer]. They traveled via car and did not visit the [employer]. Thre have been 6 locally acquired cases of Malaria in [facility]) from which she was not near. In addition, her symptoms are not classically consistent with malaria. She does have  a house in [place], where she goes on the weekends. Walks her dogs in the woods. She has not noticed ticks on her or rash. Her clinical picture is most c/w tick borne illness. Ehrlichioses and anaplasmosis can occur without a rash and present with HA/fever/myalgia/ leukopenia/ thrombocytopenia and elevated LFTs. As such will start doxycyline. If her symptoms worsen scientifically over the course of the day, consider LP to look for other etiology.  Recommendations:  -Start doxycycline '100mg'$  PO bid -Consider LP if symptoms worsen with Cx, cell count, PTN and GLC -Follow ehrlichia/RMSF/lyme/A phagocytophilum serology -Follow blood  Cx -Follow-up Plasmodium PCR Microbiology:    Cultures: Blood 7/13   HPI: [name] is a 60 y.o. female with ulcerative colitis managed with mesalamine, history of continued adenoma followed by Dr. Chalmers Cater admitted with fatigue and myalgias.  She traveled to [place] 4 days ago, was feeling fine.  On Sunday evening patient reported that patient reported that she had myalgias and fatigue.  She woke up the next day with a headache that radiated from top of her head to her neck and back.  She has associated photosensitivity/nausea/red urine.  She denies any abdominal pain, diarrhea, dysuria, rash.  She also has a home-improvement she was in the woods to walk her dog almost every weekend.  On arrival to the ED she had a temp 100.1, WBC 3.7 K. 420/386/373 AST/ALT/ALP, wbc 2.1K, plt 133.  CT abdomen pelvis showed no acute abnormalities, mild per-Portal edema present(nonspecific can be seen with acute hepatitis).  Hep panel negative.  She was admitted for further management.   Review of Systems: Review of Systems  All other systems reviewed and are negative.   History reviewed. No pertinent past medical history.  Social History   Tobacco Use   Smoking status: Former    Types: Cigarettes    Quit date: 2016    Years since quitting: 7.5   Smokeless tobacco: Never  Substance Use Topics   Alcohol use: Yes   Drug use: No    Family History  Problem Relation Age of Onset   Goiter Mother    Irregular heart beat Mother        Afibrillation   Heart attack  Mother    Heart failure Father    Thyroid disease Father    Irregular heart beat Father        Afibrillation   Heart disease Father    Diabetes Brother    Scheduled Meds:  DULoxetine  30 mg Oral BID   enoxaparin (LOVENOX) injection  40 mg Subcutaneous Q24H   mesalamine  2.4 g Oral Q breakfast   saccharomyces boulardii   Oral Daily   sodium chloride flush  3 mL Intravenous Q12H   Continuous Infusions:  sodium chloride 100  mL/hr at 11/03/21 0303   doxycycline (VIBRAMYCIN) IV 100 mg (11/03/21 1007)   PRN Meds:.acetaminophen **OR** acetaminophen, albuterol, morphine injection, ondansetron (ZOFRAN) IV, oxyCODONE, traZODone No Known Allergies  OBJECTIVE: Blood pressure 117/80, pulse 67, temperature 98.6 F (37 C), temperature source Oral, resp. rate 11, height '5\' 7"'$  (1.702 m), weight 84.8 kg, SpO2 95 %.  Physical Exam HENT:     Head: Normocephalic and atraumatic.     Right Ear: Tympanic membrane normal.     Left Ear: Tympanic membrane normal.     Nose: Nose normal.     Mouth/Throat:     Mouth: Mucous membranes are moist.  Eyes:     Extraocular Movements: Extraocular movements intact.     Conjunctiva/sclera: Conjunctivae normal.     Pupils: Pupils are equal, round, and reactive to light.  Cardiovascular:     Rate and Rhythm: Normal rate and regular rhythm.     Heart sounds: No murmur heard.    No friction rub. No gallop.  Pulmonary:     Effort: Pulmonary effort is normal.     Breath sounds: Normal breath sounds.  Abdominal:     General: Abdomen is flat.     Palpations: Abdomen is soft.  Musculoskeletal:        General: Normal range of motion.     Comments: Full rom with neck movement  Skin:    General: Skin is warm and dry.  Neurological:     General: No focal deficit present.     Mental Status: She is oriented to person, place, and time.  Psychiatric:        Mood and Affect: Mood normal.     Lab Results Lab Results  Component Value Date   WBC 3.4 (L) 11/03/2021   HGB 10.7 (L) 11/03/2021   HCT 32.8 (L) 11/03/2021   MCV 88.4 11/03/2021   PLT 123 (L) 11/03/2021    Lab Results  Component Value Date   CREATININE 0.59 11/03/2021   BUN 8 11/03/2021   NA 138 11/03/2021   K 3.5 11/03/2021   CL 104 11/03/2021   CO2 23 11/03/2021    Lab Results  Component Value Date   ALT 248 (H) 11/03/2021   AST 172 (H) 11/03/2021   ALKPHOS 391 (H) 11/03/2021   BILITOT 0.9 11/03/2021        Laurice Record, [facility] Infectious [facility] 11/03/2021, 11:19 AM

## 2021-11-04 DIAGNOSIS — R519 Headache, unspecified: Secondary | ICD-10-CM | POA: Diagnosis not present

## 2021-11-04 LAB — C-REACTIVE PROTEIN: CRP: 7 mg/dL — ABNORMAL HIGH (ref <1.0)

## 2021-11-04 LAB — COMPREHENSIVE METABOLIC PANEL
ALT: 193 U/L — ABNORMAL HIGH (ref 0–44)
AST: 97 U/L — ABNORMAL HIGH (ref 15–41)
Albumin: 2.7 g/dL — ABNORMAL LOW (ref 3.5–5.0)
Alkaline Phosphatase: 382 U/L — ABNORMAL HIGH (ref 38–126)
Anion gap: 10 (ref 5–15)
BUN: 8 mg/dL (ref 6–20)
CO2: 25 mmol/L (ref 22–32)
Calcium: 8.3 mg/dL — ABNORMAL LOW (ref 8.9–10.3)
Chloride: 105 mmol/L (ref 98–111)
Creatinine, Ser: 0.6 mg/dL (ref 0.44–1.00)
GFR, Estimated: >60 mL/min (ref >60)
Glucose, Bld: 105 mg/dL — ABNORMAL HIGH (ref 70–99)
Potassium: 3.8 mmol/L (ref 3.5–5.1)
Sodium: 140 mmol/L (ref 135–145)
Total Bilirubin: 0.7 mg/dL (ref 0.3–1.2)
Total Protein: 5.1 g/dL — ABNORMAL LOW (ref 6.5–8.1)

## 2021-11-04 LAB — CBC WITH DIFFERENTIAL/PLATELET
Abs Immature Granulocytes: 0.02 10*3/uL (ref 0.00–0.07)
Basophils Absolute: 0 10*3/uL (ref 0.0–0.1)
Basophils Relative: 0 %
Eosinophils Absolute: 0 10*3/uL (ref 0.0–0.5)
Eosinophils Relative: 0 %
HCT: 31.5 % — ABNORMAL LOW (ref 36.0–46.0)
Hemoglobin: 10.2 g/dL — ABNORMAL LOW (ref 12.0–15.0)
Immature Granulocytes: 0 %
Lymphocytes Relative: 35 %
Lymphs Abs: 1.7 10*3/uL (ref 0.7–4.0)
MCH: 28.3 pg (ref 26.0–34.0)
MCHC: 32.4 g/dL (ref 30.0–36.0)
MCV: 87.5 fL (ref 80.0–100.0)
Monocytes Absolute: 0.5 10*3/uL (ref 0.1–1.0)
Monocytes Relative: 10 %
Neutro Abs: 2.5 10*3/uL (ref 1.7–7.7)
Neutrophils Relative %: 55 %
Platelets: 142 10*3/uL — ABNORMAL LOW (ref 150–400)
RBC: 3.6 MIL/uL — ABNORMAL LOW (ref 3.87–5.11)
RDW: 13.6 % (ref 11.5–15.5)
WBC: 4.7 10*3/uL (ref 4.0–10.5)
nRBC: 0 % (ref 0.0–0.2)

## 2021-11-04 LAB — PROCALCITONIN: Procalcitonin: 0.21 ng/mL

## 2021-11-04 LAB — TECHNOLOGIST SMEAR REVIEW

## 2021-11-04 MED ORDER — DIPHENHYDRAMINE HCL 25 MG PO CAPS
25.0000 mg | ORAL_CAPSULE | Freq: Once | ORAL | Status: DC
  Filled 2021-11-04: qty 1

## 2021-11-04 MED ORDER — MAGNESIUM HYDROXIDE 400 MG/5ML PO SUSP
30.0000 mL | Freq: Once | ORAL | Status: AC
Start: 2021-11-04 — End: 2021-11-04
  Administered 2021-11-04: 30 mL via ORAL
  Filled 2021-11-04: qty 30

## 2021-11-04 MED ORDER — POLYETHYLENE GLYCOL 3350 17 G PO PACK
17.0000 g | PACK | Freq: Every day | ORAL | Status: DC
  Administered 2021-11-04: 17 g via ORAL
  Filled 2021-11-04 (×2): qty 1

## 2021-11-04 NOTE — Progress Notes (Signed)
PROGRESS NOTE        PATIENT DETAILS Name: [name] Age: 61 y.o. Sex: female Date of Birth: Aug 27, 1960 Admit Date: 11/01/2021 Admitting Physician Kayleen Memos, DO EVO:[id number], [name], MD  Brief Summary: Patient is a 61 y.o.  female with history of UC-on mesalamine, pituitary adenoma-who presented with 4-day history of fever, myalgias, headache-was found to have transaminitis, leukopenia/thrombocytopenia-and subsequently admitted to the hospitalist service.  She recently traveled to [place] also to her cabin in [place].  Significant events: 7/12>> admit to [facility]-fever/myalgias/headache/transaminitis/leukopenia/thrombocytopenia  Significant studies: 7/12>> CT head: No acute intracranial abnormality 7/12 >>CT C-spine: No acute intracranial abnormality 7/12>> CT abdomen/pelvis: No acute/focal lesions to explain patient's abdominal pain. 7/12>> CXR: No pneumonia 7/12>> UA: No evidence of UTI  Significant microbiology data: 7/12>> acute hepatitis serology: Negative 7/12>> mononucleosis screen: Negative 7/13>> HIV: Nonreactive 7/13>> respiratory virus panel: Negative 7/13>> blood culture: No growth 7/13>> Plasmodium species PCR: Pending 7/14>> RMSF panel: Pending 1/82>> Ehrlichia antibody panel: Pending 7/14>> Lyme disease serology: Pending  Procedures:   Consults: ID  Subjective:  Patient in bed, appears comfortable, much improved headache, no fever, no chest pain or pressure, no shortness of breath , no abdominal pain. No new focal weakness.   Objective: Vitals: Blood pressure 121/73, pulse 60, temperature 97.8 F (36.6 C), temperature source Oral, resp. rate 16, height '5\' 7"'$  (1.702 m), weight 84.8 kg, SpO2 95 %.   Exam:  Awake Alert, No new F.N deficits, Normal affect [place].AT,PERRAL Supple Neck, No JVD,   Symmetrical Chest wall movement, Good air movement bilaterally, CTAB RRR,No Gallops, Rubs or new Murmurs,  +ve B.Sounds, Abd Soft,  No tenderness,   No Cyanosis, Clubbing or edema    Assessment/Plan:  Fever/fatigue/myalgias/headache with transaminitis/thrombocytopenia/leukopenia: her overall presentation remains consistent with tickborne illness versus viral illness, (walks her dog in the woods in her cabin in [place]- has not noticed a tick bite though)-fortunately she is much improved on doxycycline and feeling at least 50% better, pending RMSF/Ehrlichia antibody, Plasmodium serology, peripheral blood smear prelim report stable, ID following.  Transaminitis: Likely related to tickborne illness-improving-follow.  Leukopenia/thrombocytopenia: Likely related to tickborne vs Viral illness-improving-follow  Ulcerative colitis: Stable-continue mesalamine  Depression: Continue Cymbalta  BMI: Estimated body mass index is 29.28 kg/m as calculated from the following:   Height as of this encounter: '5\' 7"'$  (1.702 m).   Weight as of this encounter: 84.8 kg.   Code status:   Code Status: Full Code   DVT Prophylaxis: enoxaparin (LOVENOX) injection 40 mg Start: 11/02/21 1800   Family Communication: Spouse at bedside   Disposition Plan: Status is: Observation The patient will require care spanning > 2 midnights and should be moved to inpatient because: Severe myalgias/headaches-starting IV doxycycline-not yet stable for discharge.   Planned Discharge Destination:Home   Diet: Diet Order             Diet regular Room service appropriate? Yes; Fluid consistency: Thin  Diet effective now                     Antimicrobial agents: Anti-infectives (From admission, onward)    Start     Dose/Rate Route Frequency Ordered Stop   11/03/21 0851  doxycycline (VIBRAMYCIN) 100 mg in sodium chloride 0.9 % 250 mL IVPB        100 mg 125 mL/hr over 120 Minutes Intravenous  Every 12 hours 11/03/21 0851          MEDICATIONS: Scheduled Meds:  DULoxetine  30 mg Oral BID   enoxaparin (LOVENOX) injection  40 mg Subcutaneous  Q24H   mesalamine  2.4 g Oral Q breakfast   saccharomyces boulardii   Oral Daily   sodium chloride flush  3 mL Intravenous Q12H   Continuous Infusions:  doxycycline (VIBRAMYCIN) IV 100 mg (11/04/21 1040)   PRN Meds:.acetaminophen **OR** acetaminophen, albuterol, morphine injection, oxyCODONE, prochlorperazine, traZODone   I have personally reviewed following labs and imaging studies  LABORATORY DATA:  Recent Labs  Lab 11/01/21 1303 11/02/21 1609 11/03/21 0024 11/04/21 0158  WBC 2.1* 3.4* 3.4* 4.7  HGB 13.7 11.5* 10.7* 10.2*  HCT 42.3 35.8* 32.8* 31.5*  PLT 133* 127* 123* 142*  MCV 89.6 89.1 88.4 87.5  MCH 29.0 28.6 28.8 28.3  MCHC 32.4 32.1 32.6 32.4  RDW 13.5 13.6 13.6 13.6  LYMPHSABS 0.3* 0.9  --  1.7  MONOABS 0.2 0.4  --  0.5  EOSABS 0.0 0.0  --  0.0  BASOSABS 0.0 0.0  --  0.0    Recent Labs  Lab 11/01/21 1303 11/01/21 1403 11/02/21 1609 11/03/21 0024 11/04/21 0158  NA 138  --  137 138 140  K 3.7  --  3.8 3.5 3.8  CL 100  --  104 104 105  CO2 26  --  '26 23 25  '$ GLUCOSE 122*  --  106* 122* 105*  BUN 12  --  '7 8 8  '$ CREATININE 0.64  --  0.71 0.59 0.60  CALCIUM 9.5  --  8.1* 8.1* 8.3*  AST  --  420* 214* 172* 97*  ALT  --  386* 279* 248* 193*  ALKPHOS  --  373* 372* 391* 382*  BILITOT  --  1.5* 0.9 0.9 0.7  ALBUMIN  --  4.4 2.8* 2.8* 2.7*  CRP  --   --  9.9*  --   --   PROCALCITON  --   --   --   --  0.21  INR  --   --  1.1  --   --     RADIOLOGY STUDIES/RESULTS: No results found.   LOS: 1 day   Signature  Lala Lund M.D on 11/04/2021 at 10:50 AM   -  To page go to [email or web address]

## 2021-11-04 NOTE — Plan of Care (Signed)

## 2021-11-05 DIAGNOSIS — R519 Headache, unspecified: Secondary | ICD-10-CM | POA: Diagnosis not present

## 2021-11-05 LAB — CBC WITH DIFFERENTIAL/PLATELET
Abs Immature Granulocytes: 0.05 10*3/uL (ref 0.00–0.07)
Basophils Absolute: 0 10*3/uL (ref 0.0–0.1)
Basophils Relative: 1 %
Eosinophils Absolute: 0 10*3/uL (ref 0.0–0.5)
Eosinophils Relative: 1 %
HCT: 30.7 % — ABNORMAL LOW (ref 36.0–46.0)
Hemoglobin: 10.2 g/dL — ABNORMAL LOW (ref 12.0–15.0)
Immature Granulocytes: 1 %
Lymphocytes Relative: 39 %
Lymphs Abs: 2.2 10*3/uL (ref 0.7–4.0)
MCH: 28.3 pg (ref 26.0–34.0)
MCHC: 33.2 g/dL (ref 30.0–36.0)
MCV: 85 fL (ref 80.0–100.0)
Monocytes Absolute: 0.5 10*3/uL (ref 0.1–1.0)
Monocytes Relative: 9 %
Neutro Abs: 2.8 10*3/uL (ref 1.7–7.7)
Neutrophils Relative %: 49 %
Platelets: 189 10*3/uL (ref 150–400)
RBC: 3.61 MIL/uL — ABNORMAL LOW (ref 3.87–5.11)
RDW: 13.5 % (ref 11.5–15.5)
Smear Review: NORMAL
WBC Morphology: ABNORMAL
WBC: 5.7 10*3/uL (ref 4.0–10.5)
nRBC: 0 % (ref 0.0–0.2)

## 2021-11-05 LAB — HEPATITIS PANEL, ACUTE
HCV Ab: NONREACTIVE
Hep A IgM: NONREACTIVE
Hep B C IgM: NONREACTIVE
Hepatitis B Surface Ag: NONREACTIVE

## 2021-11-05 LAB — COMPREHENSIVE METABOLIC PANEL
ALT: 168 U/L — ABNORMAL HIGH (ref 0–44)
AST: 81 U/L — ABNORMAL HIGH (ref 15–41)
Albumin: 2.9 g/dL — ABNORMAL LOW (ref 3.5–5.0)
Alkaline Phosphatase: 358 U/L — ABNORMAL HIGH (ref 38–126)
Anion gap: 8 (ref 5–15)
BUN: 11 mg/dL (ref 6–20)
CO2: 22 mmol/L (ref 22–32)
Calcium: 8 mg/dL — ABNORMAL LOW (ref 8.9–10.3)
Chloride: 108 mmol/L (ref 98–111)
Creatinine, Ser: 0.52 mg/dL (ref 0.44–1.00)
GFR, Estimated: >60 mL/min (ref >60)
Glucose, Bld: 103 mg/dL — ABNORMAL HIGH (ref 70–99)
Potassium: 3.6 mmol/L (ref 3.5–5.1)
Sodium: 138 mmol/L (ref 135–145)
Total Bilirubin: 0.5 mg/dL (ref 0.3–1.2)
Total Protein: 5.6 g/dL — ABNORMAL LOW (ref 6.5–8.1)

## 2021-11-05 LAB — MAGNESIUM: Magnesium: 2.3 mg/dL (ref 1.7–2.4)

## 2021-11-05 LAB — PROCALCITONIN: Procalcitonin: 0.13 ng/mL

## 2021-11-05 LAB — C-REACTIVE PROTEIN: CRP: 3.2 mg/dL — ABNORMAL HIGH (ref <1.0)

## 2021-11-05 MED ORDER — IBUPROFEN 600 MG PO TABS: 600.0000 mg | ORAL_TABLET | Freq: Three times a day (TID) | ORAL | 0 refills | Status: DC | PRN

## 2021-11-05 MED ORDER — DOXYCYCLINE HYCLATE 100 MG PO TABS: 100.0000 mg | ORAL_TABLET | Freq: Two times a day (BID) | ORAL | 0 refills | Status: DC

## 2021-11-05 MED ORDER — DOXYCYCLINE HYCLATE 100 MG PO TABS
100.0000 mg | ORAL_TABLET | Freq: Two times a day (BID) | ORAL | Status: DC
  Administered 2021-11-05: 100 mg via ORAL
  Filled 2021-11-05: qty 1

## 2021-11-05 MED ORDER — CYCLOBENZAPRINE HCL 10 MG PO TABS: 10.0000 mg | ORAL_TABLET | Freq: Three times a day (TID) | ORAL | 0 refills | Status: DC | PRN

## 2021-11-05 MED ORDER — ACETAMINOPHEN 325 MG PO TABS: 650.0000 mg | ORAL_TABLET | Freq: Four times a day (QID) | ORAL | 0 refills | Status: DC | PRN

## 2021-11-05 MED ORDER — PANTOPRAZOLE SODIUM 40 MG PO TBEC: 40.0000 mg | DELAYED_RELEASE_TABLET | Freq: Every day | ORAL | 0 refills | Status: DC

## 2021-11-05 NOTE — Discharge Summary (Signed)
[name] BTY:[id number] DOB: July 26, 1960 DOA: 11/01/2021  PCP: Lavone Orn, MD  Admit date: 11/01/2021  Discharge date: 11/05/2021  Admitted From: Home   Disposition:  Home   Recommendations for Outpatient Follow-up:   Follow up with PCP in 1-2 weeks  PCP Please obtain BMP/CBC, 2 view CXR in 1week,  (see Discharge instructions)   PCP Please follow up on the following pending results: Kindly follow pending results on Plasmodium, [facility] spotted fever, Lyme and ehrlichiosis.  Repeat CBC and CMP in 7 to 10 days   Home Health: None   Equipment/Devices: None  Consultations: ID Discharge Condition: Stable    CODE STATUS: Full    Diet Recommendation: Heart Healthy     Chief Complaint  Patient presents with   Fatigue     Brief history of present illness from the day of admission and additional interim summary    61 y.o.  female with history of UC-on mesalamine, pituitary adenoma-who presented with 4-day history of fever, myalgias, headache-was found to have transaminitis, leukopenia/thrombocytopenia-and subsequently admitted to the hospitalist service.  She recently traveled to [place] also to her cabin in [place].   Significant events: 7/12>> admit to [facility]-fever/myalgias/headache/transaminitis/leukopenia/thrombocytopenia   Significant studies: 7/12>> CT head: No acute intracranial abnormality 7/12 >>CT C-spine: No acute intracranial abnormality 7/12>> CT abdomen/pelvis: No acute/focal lesions to explain patient's abdominal pain. 7/12>> CXR: No pneumonia 7/12>> UA: No evidence of UTI   Significant microbiology data: 7/12>> acute hepatitis serology: Negative 7/12>> mononucleosis screen: Negative 7/13>> HIV: Nonreactive 7/13>> respiratory virus panel: Negative 7/13>> blood culture: No  growth 7/13>> Plasmodium species PCR: Pending 7/14>> RMSF panel: Pending 7/74>> Ehrlichia antibody panel: Pending 7/14>> Lyme disease serology: Pending                                                                 Hospital Course   Fever/fatigue/myalgias/headache with transaminitis/thrombocytopenia/leukopenia: her overall presentation remains consistent with tickborne illness versus viral illness, (walks her dog in the woods in her cabin in [place]- has not noticed a tick bite though)-fortunately she is much improved on doxycycline and feeling much improved, mild back pain and headache likely due to lack of sleep from being in the hospital, overall feels much better and wants to go home, will place her on 7 more days of oral doxycycline with outpatient PCP and ID follow-up, request PCP to kindly follow pending Lyme, RMSF/Ehrlichia antibody, Plasmodium serology, peripheral blood smear prelim report stable, clinically better follow-up with ID and PCP outpatient.   Transaminitis: Likely related to tickborne illness-improving-follow.  Acute hepatitis panel unremarkable, CT scan suggestive of mild acute hepatitis again likely due to #1 above.  PCP to repeat CMP in 7 to 10 days   Leukopenia/thrombocytopenia: Likely related to tickborne vs Viral illness-improving-PCP to repeat CBC in 7 to  10 days.   Ulcerative colitis: Stable-continue mesalamine  Depression: Continue Cymbalta   Discharge diagnosis     Principal Problem:   Fatigue Active Problems:   Myalgia   Ulcerative colitis ([place])   Pancytopenia (HCC)   Transaminitis   Hyperbilirubinemia    Discharge instructions    Discharge Instructions     Diet - low sodium heart healthy   Complete by: As directed    Discharge instructions   Complete by: As directed    Follow with Primary MD Lavone Orn, MD in 7 days   Get CBC, CMP, magnesium,-  checked next visit within 1 week by Primary MD, kindly follow-up on pending results for [facility] spotted fever, Lyme, ehrlichiosis, hepatitis.  Activity: As tolerated with Full fall precautions use walker/cane & assistance as needed  Disposition Home   Diet: Heart Healthy  Special Instructions: If you have smoked or chewed Tobacco  in the last 2 yrs please stop smoking, stop any regular Alcohol  and or any Recreational drug use.  On your next visit with your primary care physician please Get Medicines reviewed and adjusted.  Please request your Prim.MD to go over all Hospital Tests and Procedure/Radiological results at the follow up, please get all Hospital records sent to your Prim MD by signing hospital release before you go home.  If you experience worsening of your admission symptoms, develop shortness of breath, life threatening emergency, suicidal or homicidal thoughts you must seek medical attention immediately by calling 911 or calling your MD immediately  if symptoms less severe.  You Must read complete instructions/literature along with all the possible adverse reactions/side effects for all the Medicines you take and that have been prescribed to you. Take any new Medicines after you have completely understood and accpet all the possible adverse reactions/side effects.   Increase activity slowly   Complete by: As directed        Discharge Medications   Allergies as of 11/05/2021   No Known Allergies      Medication List     STOP taking these medications    Lomaira 8 MG Tabs Generic drug: Phentermine HCl   terbinafine 250 MG tablet Commonly known as: LAMISIL       TAKE these medications    acetaminophen 325 MG tablet Commonly known as: TYLENOL Take 2 tablets (650 mg total) by mouth every 6 (six) hours as needed for mild pain, headache or fever (or Fever >/= 101).   aspirin EC 81 MG tablet Take 1 tablet (81 mg total) by mouth daily.   cyclobenzaprine 10 MG tablet Commonly known as: FLEXERIL Take 1 tablet (10 mg total) by mouth 3 (three)  times daily as needed for muscle spasms.   doxycycline 100 MG tablet Commonly known as: VIBRA-TABS Take 1 tablet (100 mg total) by mouth every 12 (twelve) hours.   DULoxetine 30 MG capsule Commonly known as: CYMBALTA Take 1 capsule by mouth twice a day   ibuprofen 600 MG tablet Commonly known as: ADVIL Take 1 tablet (600 mg total) by mouth every 8 (eight) hours as needed for headache.   Lialda 1.2 g EC tablet Generic drug: mesalamine Take 2 tablets by mouth daily with breakfast.   ondansetron 4 MG tablet Commonly known as: ZOFRAN Take 1 tablet (4 mg total) by mouth every 6 (six) hours.   pantoprazole 40 MG tablet Commonly known as: Protonix Take 1 tablet (40 mg total) by mouth daily.   PROBIOTIC DAILY PO Take 1 tablet  by mouth daily.   traZODone 50 MG tablet Commonly known as: DESYREL Take 1-3 tablets by mouth nightly at bedtime as needed         Follow-up Information     Lavone Orn, MD. Schedule an appointment as soon as possible for a visit in 1 week(s).   Specialty: Internal Medicine Contact information: [address] [place] [address] [phone]         Laurice Record, MD. Schedule an appointment as soon as possible for a visit in 1 week(s).   Specialty: Infectious Diseases Contact information: [address], [place] [address] [phone]                 Major procedures and Radiology Reports - PLEASE review detailed and final reports thoroughly  -       CT ABDOMEN PELVIS W CONTRAST  Result Date: 11/01/2021 CLINICAL DATA:  Abdominal pain, acute, nonlocalized.  Elevated LFTs. EXAM: CT ABDOMEN AND PELVIS WITH CONTRAST TECHNIQUE: Multidetector CT imaging of the abdomen and pelvis was performed using the standard protocol following bolus administration of intravenous contrast. RADIATION DOSE REDUCTION: This exam was performed according to the departmental dose-optimization program which includes  automated exposure control, adjustment of the mA and/or kV according to patient size and/or use of iterative reconstruction technique. CONTRAST:  70m OMNIPAQUE IOHEXOL 300 MG/ML  SOLN COMPARISON:  CT of the abdomen pelvis 08/15/2012. FINDINGS: Lower chest: The lung bases are clear without focal nodule, mass, mild dependent atelectasis is present. No significant airspace consolidation present. Heart size is normal. Hepatobiliary: Mild periportal edema is present. No discrete lesions are present. The common bile duct is within normal limits for age following cholecystectomy. Pancreas: Slight dilation the pancreatic duct is stable. No obstructing lesion is present. Spleen: Normal in size without focal abnormality. Adrenals/Urinary Tract: Adrenal glands are unremarkable. Kidneys are normal, without renal calculi, focal lesion, or hydronephrosis. An 8 mm simple cyst is noted in the right kidney. Recommend a follow-up bladder is unremarkable. Stomach/Bowel: Stomach and duodenum are within normal limits. Small bowel is unremarkable. The terminal ileum is within normal limits. Appendix is surgically absent. The ascending and transverse colon are within normal limits. Descending and sigmoid colon are normal. Vascular/Lymphatic: No significant vascular findings are present. No enlarged abdominal or pelvic lymph nodes. Reproductive: Uterus and bilateral adnexa are unremarkable. Clips are noted on the flow being tubes. Other: No abdominal wall hernia or abnormality. No abdominopelvic ascites. Musculoskeletal: Grade 1 degenerative anterolisthesis is present at L4-5. Vertebral body heights and alignment are otherwise normal. No focal osseous lesions are present. IMPRESSION: 1. No acute or focal lesion to explain the patient's abdominal pain. 2. Mild periportal edema is nonspecific, but can be seen in the setting of acute hepatitis. 3. Cholecystectomy and appendectomy. 4. Grade 1 degenerative anterolisthesis at L4-5.  Electronically Signed   By: CSan MorelleM.D.   On: 11/01/2021 18:02   CT Cervical Spine Wo Contrast  Result Date: 11/01/2021 CLINICAL DATA:  Neck pain, infection suspected, no prior imaging. [place] has been experiencing chills, neck pain, fatigue, headache and nausea EXAM: CT CERVICAL SPINE WITHOUT CONTRAST TECHNIQUE: Multidetector CT imaging of the cervical spine was performed without intravenous contrast. Multiplanar CT image reconstructions were also generated. RADIATION DOSE REDUCTION: This exam was performed according to the departmental dose-optimization program which includes automated exposure control, adjustment of the mA and/or kV according to patient size and/or use of iterative reconstruction  technique. COMPARISON:  None Available. FINDINGS: Alignment: There is minimal reversal of the normal lordotic curvature seen. Minimal anterolisthesis of C2 over C3. Skull base and vertebrae: No acute fracture. No primary bone lesion or focal pathologic process. Bilateral mastoid air cells and middle and external ear canals have a normal appearance. Mild cervical spondylosis. Soft tissues and spinal canal: No prevertebral fluid or swelling. No visible canal hematoma. Disc levels:  Disc levels are well-maintained. Upper chest: Negative. Other: None. IMPRESSION: Minimal reversal of the normal lordotic curvature of the cervical spine, which may be due to pain, spasm or position. Mild cervical spondylosis. Electronically Signed   By: Frazier Richards M.D.   On: 11/01/2021 15:04   CT Head Wo Contrast  Result Date: 11/01/2021 CLINICAL DATA:  Headache, sudden, severe EXAM: CT HEAD WITHOUT CONTRAST TECHNIQUE: Contiguous axial images were obtained from the base of the skull through the vertex without intravenous contrast. RADIATION DOSE REDUCTION: This exam was performed according to the departmental dose-optimization program which includes automated exposure control,  adjustment of the mA and/or kV according to patient size and/or use of iterative reconstruction technique. COMPARISON:  None Available. FINDINGS: Brain: No evidence of acute infarction, hemorrhage, hydrocephalus, extra-axial collection or visible mass lesion/mass effect. 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 Vascular: No hyperdense vessel identified. Skull: No acute fracture. Sinuses/Orbits: Visualized sinuses are clear. No acute orbital findings. Other: No mastoid effusions. IMPRESSION: No evidence of acute intracranial abnormality. Electronically Signed   By: Margaretha Sheffield M.D.   On: 11/01/2021 14:56   DG Chest Portable 1 View  Result Date: 11/01/2021 CLINICAL DATA:  Shortness of breath EXAM: PORTABLE CHEST 1 VIEW COMPARISON:  December 2018 FINDINGS: The heart size and mediastinal contours are within normal limits. Both lungs are clear. No pleural effusion or pneumothorax. The visualized skeletal structures are unremarkable. IMPRESSION: No acute process in the chest. Electronically Signed   By: Macy Mis M.D.   On: 11/01/2021 14:54     Today   Subjective    [name] today has mild generalized headache,no chest abdominal pain,no new weakness tingling or numbness, feels much better wants to go home today.     Objective   Blood pressure 117/62, pulse 69, temperature 97.8 F (36.6 C), temperature source Oral, resp. rate 15, height '5\' 7"'$  (1.702 m), weight 84.8 kg, SpO2 94 %.   Intake/Output Summary (Last 24 hours) at 11/05/2021 0905 Last data filed at 11/04/2021 2200 Gross per 24 hour  Intake 10 ml  Output 2 ml  Net 8 ml    Exam  Awake Alert, No new F.N deficits,    [place].AT,PERRAL Supple Neck,   Symmetrical Chest wall movement, Good air movement bilaterally, CTAB RRR,No Gallops,   +ve B.Sounds, Abd Soft, Non tender,  No Cyanosis, Clubbing or edema    Data Review   Recent Labs  Lab 11/01/21 1303 11/02/21 1609 11/03/21 0024 11/04/21 0158  11/05/21 0024  WBC 2.1* 3.4* 3.4* 4.7 5.7  HGB 13.7 11.5* 10.7* 10.2* 10.2*  HCT 42.3 35.8* 32.8* 31.5* 30.7*  PLT 133* 127* 123* 142* 189  MCV 89.6 89.1 88.4 87.5 85.0  MCH 29.0 28.6 28.8 28.3 28.3  MCHC 32.4 32.1 32.6 32.4 33.2  RDW 13.5 13.6 13.6 13.6 13.5  LYMPHSABS 0.3* 0.9  --  1.7 2.2  MONOABS 0.2 0.4  --  0.5 0.5  EOSABS 0.0 0.0  --  0.0 0.0  BASOSABS 0.0 0.0  --  0.0 0.0    Recent  Labs  Lab 11/01/21 1303 11/01/21 1403 11/02/21 1609 11/03/21 0024 11/03/21 1214 11/04/21 0158 11/05/21 0024  NA 138  --  137 138  --  140 138  K 3.7  --  3.8 3.5  --  3.8 3.6  CL 100  --  104 104  --  105 108  CO2 26  --  26 23  --  25 22  GLUCOSE 122*  --  106* 122*  --  105* 103*  BUN 12  --  7 8  --  8 11  CREATININE 0.64  --  0.71 0.59  --  0.60 0.52  CALCIUM 9.5  --  8.1* 8.1*  --  8.3* 8.0*  AST  --  420* 214* 172*  --  97* 81*  ALT  --  386* 279* 248*  --  193* 168*  ALKPHOS  --  373* 372* 391*  --  382* 358*  BILITOT  --  1.5* 0.9 0.9  --  0.7 0.5  ALBUMIN  --  4.4 2.8* 2.8*  --  2.7* 2.9*  MG  --   --   --   --   --   --  2.3  CRP  --   --  9.9*  --  7.0*  --  3.2*  PROCALCITON  --   --   --   --   --  0.21 0.13  INR  --   --  1.1  --   --   --   --     Total Time in preparing paper work, data evaluation and todays exam - 35 minutes  Lala Lund M.D on 11/05/2021 at 9:05 AM  [facility]

## 2021-11-06 LAB — LYME DISEASE SEROLOGY W/REFLEX: Lyme Total Antibody EIA: POSITIVE

## 2021-11-06 LAB — LYME IGG/IGM
Lyme IgG EIA: NEGATIVE
Lyme IgM EIA: POSITIVE
Lyme Interpretation: DETECTED — AB

## 2021-11-07 LAB — CULTURE, BLOOD (ROUTINE X 2)
Culture: NO GROWTH
Culture: NO GROWTH
Special Requests: ADEQUATE

## 2021-11-07 LAB — A. PHAGOCYTOPHILUM PCR: A. phagocytophilum PCR: NEGATIVE

## 2021-11-07 LAB — PLASMODIUM SP. PCR: Plasmodium Sp. PCR: NEGATIVE

## 2021-11-07 LAB — PATHOLOGIST SMEAR REVIEW

## 2021-11-08 LAB — ROCKY MTN SPOTTED FVR ABS PNL(IGG+IGM)
RMSF IgG: NEGATIVE
RMSF IgM: 0.2 index (ref 0.00–0.89)

## 2021-11-09 LAB — EHRLICHIA ANTIBODY PANEL
E chaffeensis (HGE) Ab, IgG: NEGATIVE
E chaffeensis (HGE) Ab, IgM: NEGATIVE
E. Chaffeensis (HME) IgM Titer: NEGATIVE
E.Chaffeensis (HME) IgG: NEGATIVE

## 2021-11-09 MED FILL — Fentanyl Citrate Preservative Free (PF) Inj 100 MCG/2ML: INTRAMUSCULAR | Qty: 1 | Status: AC

## 2021-12-20 ENCOUNTER — Other Ambulatory Visit (HOSPITAL_COMMUNITY): Payer: Self-pay

## 2022-01-03 ENCOUNTER — Other Ambulatory Visit: Payer: Self-pay | Admitting: Podiatry

## 2022-01-03 ENCOUNTER — Other Ambulatory Visit (HOSPITAL_COMMUNITY): Payer: Self-pay

## 2022-01-03 MED ORDER — AZITHROMYCIN 250 MG PO TABS
ORAL_TABLET | ORAL | 0 refills | Status: DC
  Filled 2022-01-03: qty 6, 5d supply, fill #0

## 2022-01-03 MED ORDER — FLUOROURACIL 5 % EX CREA
TOPICAL_CREAM | Freq: Two times a day (BID) | CUTANEOUS | 2 refills | Status: AC
  Filled 2022-01-03: qty 40, 30d supply, fill #0

## 2022-01-03 MED ORDER — CIPROFLOXACIN HCL 500 MG PO TABS
500.0000 mg | ORAL_TABLET | Freq: Two times a day (BID) | ORAL | 0 refills | Status: DC
  Filled 2022-01-03: qty 10, 5d supply, fill #0

## 2022-02-07 ENCOUNTER — Other Ambulatory Visit (HOSPITAL_BASED_OUTPATIENT_CLINIC_OR_DEPARTMENT_OTHER): Payer: Self-pay

## 2022-02-07 MED ORDER — INFLUENZA VAC SPLIT QUAD 0.5 ML IM SUSY
PREFILLED_SYRINGE | INTRAMUSCULAR | 0 refills | Status: DC
  Filled 2022-02-07: qty 0.5, 1d supply, fill #0

## 2022-03-06 ENCOUNTER — Other Ambulatory Visit: Payer: Self-pay | Admitting: Physician Assistant

## 2022-03-06 DIAGNOSIS — M5412 Radiculopathy, cervical region: Secondary | ICD-10-CM

## 2022-03-06 DIAGNOSIS — M4807 Spinal stenosis, lumbosacral region: Secondary | ICD-10-CM

## 2022-03-19 ENCOUNTER — Other Ambulatory Visit: Payer: No Typology Code available for payment source

## 2022-03-23 ENCOUNTER — Encounter: Payer: Self-pay | Admitting: Podiatrist

## 2022-03-23 ENCOUNTER — Ambulatory Visit (INDEPENDENT_AMBULATORY_CARE_PROVIDER_SITE_OTHER): Payer: 59 | Admitting: Podiatrist

## 2022-03-23 DIAGNOSIS — B070 Plantar wart: Secondary | ICD-10-CM

## 2022-03-23 DIAGNOSIS — D2371 Other benign neoplasm of skin of right lower limb, including hip: Secondary | ICD-10-CM

## 2022-03-23 NOTE — Progress Notes (Signed)
Chief Complaint  Patient presents with   Plantar Warts    Right foot plantars wart     HPI: Patient is 61 y.o. female who presents today for plantar wart x 3 on the right foot.  She had a large lesion between toes 4/5 of the right foot that went away after her last visit and treatment with the canthacur.  She has 2 lesions remaining that failed to resolve with treatment.  She has efudex at home but hasn't been applying regularly.     No Known Allergies  Review of systems is negative except as noted in the HPI.  Denies nausea/ vomiting/ fevers/ chills or night sweats.   Denies difficulty breathing, denies calf pain or tenderness  Physical Exam  Patient is awake, alert, and oriented x 3.  In no acute distress.    Vascular status is intact with palpable pedal pulses DP and PT bilateral and capillary refill time less than 3 seconds bilateral.  No edema or erythema noted.   Neurological exam reveals epicritic and protective sensation grossly intact bilateral.   Dermatological exam reveals well circumscribed lesions submet 4 of the right foot x 2.  Lesions have loss of skin tension lines and multiple capillary budding present throughout consistent with plantar wart.   Musculoskeletal exam: Musculature intact with dorsiflexion, plantarflexion, inversion, eversion. Ankle and First MPJ joint range of motion normal.     Assessment:   ICD-10-CM   1. Plantar wart  B07.0        Plan: Discussed exam and treatment recommendations.  I recommended shaving down the lesions and trying the canthacur again.  This was accomplished today without incident.  Discussed she could get a blister and how to care for the area if this occurs.  She will start applying the efudex in about a week, daily for 3-4 weeks.  If the wart fails to resolve after this treatment she will call.

## 2022-03-23 NOTE — Patient Instructions (Signed)
AFTERCARE INSTRUCTIONS FOR CANTHARIDIN (WART) TREATMENT:  1:  Leave the tape on the affected area for 24 hours after treatment-  do not get the foot wet. ** if you experience any pain, burning, or discomfort wash the area sooner than 24 hours.   2. After 24 hours- remove the tape and wash the wart with soap and water  3.  Expect blistering the day after treatment-  the blister may enlarge and fill with fluid-  if this occurs you may make a small hole in the blister using a sterile needle (can also dip a pin or sewing needle in rubbing alcohol and wipe dry) and squeeze fluid out by gently squeezing blister with clean hands. You may also apply a cool compress and take oral pain medications (tylenol or advil) for pain.  4.  Do not remove the top of the blister.  Apply vaseline twice daily for 5-10 days or until healing occurs.  A crusty lesion will occur and this will fall off on its own after 4-5 days.  5.  After skin healing has occurred after cantharidin treatment, apply    5 flourouricil Rx cream} to the base of the lesion once a day for 3-4 weeks as tolerated to prevent wart recurrences around the treatment edges.    6.  The treated area may look red or may itch.  Call the office if there is still significant burning, stinging or pain after the blister is popped.   7.  It is rare, but anyone can have a severe allergic reaction to any medication-  call 911 if you have a severe reaction (trouble breathing, hives, trouble swallowing) after treatment.

## 2022-04-03 ENCOUNTER — Ambulatory Visit
Admission: RE | Admit: 2022-04-03 | Discharge: 2022-04-03 | Disposition: A | Discharge status: Home / Self Care | Payer: No Typology Code available for payment source | Source: Ambulatory Visit | Attending: Physician Assistant | Admitting: Physician Assistant

## 2022-04-03 DIAGNOSIS — M4807 Spinal stenosis, lumbosacral region: Secondary | ICD-10-CM

## 2022-04-03 DIAGNOSIS — M5412 Radiculopathy, cervical region: Secondary | ICD-10-CM

## 2022-04-09 ENCOUNTER — Other Ambulatory Visit (HOSPITAL_COMMUNITY): Payer: Self-pay

## 2022-04-10 ENCOUNTER — Other Ambulatory Visit (HOSPITAL_COMMUNITY): Payer: Self-pay

## 2022-04-10 MED ORDER — HYDROCOD POLI-CHLORPHE POLI ER 10-8 MG/5ML PO SUER
ORAL | 0 refills | Status: DC
  Filled 2022-04-10: qty 70, 7d supply, fill #0

## 2022-04-10 MED ORDER — AMOXICILLIN-POT CLAVULANATE 875-125 MG PO TABS
1.0000 | ORAL_TABLET | Freq: Two times a day (BID) | ORAL | 0 refills | Status: DC
  Filled 2022-04-10: qty 20, 10d supply, fill #0

## 2022-04-10 MED ORDER — MESALAMINE 1.2 G PO TBEC
2.4000 g | DELAYED_RELEASE_TABLET | Freq: Two times a day (BID) | ORAL | 4 refills | Status: AC
  Filled 2022-04-10 – 2022-10-15 (×4): qty 360, 90d supply, fill #0

## 2022-04-10 MED ORDER — MESALAMINE 1000 MG RE SUPP
1000.0000 mg | Freq: Every evening | RECTAL | 4 refills | Status: AC
  Filled 2022-04-10 (×2): qty 90, 90d supply, fill #0

## 2022-04-10 MED ORDER — BENZONATATE 200 MG PO CAPS
200.0000 mg | ORAL_CAPSULE | Freq: Three times a day (TID) | ORAL | 0 refills | Status: DC
  Filled 2022-04-10: qty 15, 5d supply, fill #0

## 2022-04-11 ENCOUNTER — Other Ambulatory Visit (HOSPITAL_COMMUNITY): Payer: Self-pay

## 2022-04-11 ENCOUNTER — Other Ambulatory Visit: Payer: Self-pay

## 2022-04-12 ENCOUNTER — Other Ambulatory Visit: Payer: Self-pay

## 2022-05-31 ENCOUNTER — Ambulatory Visit (INDEPENDENT_AMBULATORY_CARE_PROVIDER_SITE_OTHER): Payer: 59 | Admitting: Podiatry

## 2022-05-31 DIAGNOSIS — M67472 Ganglion, left ankle and foot: Secondary | ICD-10-CM | POA: Diagnosis not present

## 2022-05-31 DIAGNOSIS — M6780 Other specified disorders of synovium and tendon, unspecified site: Secondary | ICD-10-CM | POA: Diagnosis not present

## 2022-05-31 NOTE — Progress Notes (Signed)
  Subjective:  Patient ID: [name], female    DOB: 17-Apr-1961,  MRN: [id number]  Chief Complaint  Patient presents with   Foot Problem    Left top of the foot soft tissue mass, patient notice 2 days ago,  patient denies any pain    62 y.o. female presents with concern for soft tissue mass present on the top of the left foot.  She says she has recently noticed this quarter sized soft tissue mass that appears raised off the inside of the top of the right foot she.  Says that this does not bother her all she has no pain with that she just want to get it checked out to make sure was not something serious that needed removal urgently.  No past medical history on file.  No Known Allergies  ROS: Negative except as per HPI above  Objective:  General: AAO x3, NAD  Dermatological: Raised soft tissue lesion without any erythema at the dorsal midfoot approximately overlying the first tarsometatarsal joint approximately quarter sized  Vascular:  Dorsalis Pedis artery and Posterior Tibial artery pedal pulses are 2/4 bilateral.  Capillary fill time < 3 sec to all digits.   Neruologic: Grossly intact via light touch bilateral. Protective threshold intact to all sites bilateral.   Musculoskeletal: Palpable soft tissue mass present over the first tarsometatarsal joint on the left foot.  Mass is mobile nontender with palpation does appear to be surrounding the EHL tendon.  No significant inflammation.  Gait: Unassisted, Nonantalgic.   No images are attached to the encounter.  Radiographs:  Deferred Assessment:   1. Ganglion cyst of left foot   2. Cyst of tendon sheath      Plan:  Patient was evaluated and treated and all questions answered.  # Likely ganglion cyst versus tendon sheath cyst of the EHL tendon in the left dorsal midfoot over the first TMT J -Discussed with the patient that the soft tissue mass does not appear to be anything concerning it is very unlikely that it would  be anything malignant.  However we cannot completely rule this out but it is highly unlikely. -As the lesion does not cause her any pain I offered to continue to monitor the soft tissue mass and watch for enlargement or if it begins to cause her discomfort. -Also offered to drain the mass with needle aspiration or surgical excision however I do not feel this is warranted given the absence of pain. -Patient would like to continue monitoring the soft tissue mass and watch for any changes enlargement or if it begins to cause pain and will follow-up as needed if it does otherwise she is happy to continue to monitor.  Return if symptoms worsen or fail to improve.          Everitt Amber, DPM [facility]

## 2022-07-03 ENCOUNTER — Other Ambulatory Visit (HOSPITAL_COMMUNITY): Payer: Self-pay

## 2022-07-04 ENCOUNTER — Other Ambulatory Visit: Payer: Self-pay

## 2022-07-04 ENCOUNTER — Other Ambulatory Visit (HOSPITAL_COMMUNITY): Payer: Self-pay

## 2022-07-04 ENCOUNTER — Encounter (HOSPITAL_COMMUNITY): Payer: Self-pay

## 2022-08-02 ENCOUNTER — Other Ambulatory Visit (HOSPITAL_COMMUNITY): Payer: Self-pay

## 2022-08-02 MED ORDER — GOLYTELY 236 G PO SOLR
ORAL | 0 refills | Status: DC
  Filled 2022-08-02: qty 4000, 1d supply, fill #0

## 2022-08-09 ENCOUNTER — Other Ambulatory Visit (HOSPITAL_COMMUNITY): Payer: Self-pay

## 2022-09-18 ENCOUNTER — Ambulatory Visit (INDEPENDENT_AMBULATORY_CARE_PROVIDER_SITE_OTHER): Payer: 59 | Admitting: Podiatry

## 2022-09-18 ENCOUNTER — Ambulatory Visit (INDEPENDENT_AMBULATORY_CARE_PROVIDER_SITE_OTHER): Payer: 59

## 2022-09-18 ENCOUNTER — Encounter: Payer: Self-pay | Admitting: Podiatry

## 2022-09-18 DIAGNOSIS — R252 Cramp and spasm: Secondary | ICD-10-CM | POA: Diagnosis not present

## 2022-09-18 DIAGNOSIS — M722 Plantar fascial fibromatosis: Secondary | ICD-10-CM

## 2022-09-18 DIAGNOSIS — M2012 Hallux valgus (acquired), left foot: Secondary | ICD-10-CM | POA: Diagnosis not present

## 2022-09-18 DIAGNOSIS — R7303 Prediabetes: Secondary | ICD-10-CM | POA: Insufficient documentation

## 2022-09-18 DIAGNOSIS — Z860100 Personal history of colon polyps, unspecified: Secondary | ICD-10-CM | POA: Insufficient documentation

## 2022-09-18 DIAGNOSIS — M21612 Bunion of left foot: Secondary | ICD-10-CM | POA: Diagnosis not present

## 2022-09-18 DIAGNOSIS — B070 Plantar wart: Secondary | ICD-10-CM | POA: Insufficient documentation

## 2022-09-18 DIAGNOSIS — M21619 Bunion of unspecified foot: Secondary | ICD-10-CM

## 2022-09-18 DIAGNOSIS — R1032 Left lower quadrant pain: Secondary | ICD-10-CM | POA: Insufficient documentation

## 2022-09-18 DIAGNOSIS — M6289 Other specified disorders of muscle: Secondary | ICD-10-CM

## 2022-09-18 DIAGNOSIS — K219 Gastro-esophageal reflux disease without esophagitis: Secondary | ICD-10-CM | POA: Insufficient documentation

## 2022-09-18 NOTE — Progress Notes (Signed)
Chief Complaint  Patient presents with   Bunions    Left foot bunion has gotten worse since last visit. 2nd digit starting to turn into a hammer toe. Has calf tightness every morning.   Plantar Warts    Right foot lesions treated by Dr. Charlsie Merles in the past.     HPI: 62 y.o. female presenting today for several concerns today: 1.  She would like to have her left bunion evaluated.  She notes that it seems to have worsened in position and is now getting some contracture of the left second toe.  Has occasional pain to the area.  She has been evaluated for this in the past at this office.  X-rays will be obtained today. 2.  She notes that her calves feel very tight and often cramp at night.  She does take a calcium plus magnesium supplement in the evening.  She also notes that she has an upcoming appointment with neurology due to lower back issues.  This usually affects her left side.  She is wondering if this also can be contributing to the tightness in her calves and discomfort.  She points to the lateral aspect of the left leg where it has been most tender especially during a recent massage appointment.  Denies symptoms of intermittent claudication. 3.  She has been evaluated at this office previously for plantar warts on the ball of the right foot.  She feels that they are back and does request treatment today.   Past Surgical History:  Procedure Laterality Date   APPENDECTOMY     CHOLECYSTECTOMY     ENDOMETRIAL ABLATION     LAPAROTOMY     laproscopic kne     TONSILLECTOMY      No Known Allergies  Review of Systems  Musculoskeletal:  Positive for back pain and myalgias.       Tightness in legs bilateral. Hallux valgus with bunion left foot. Contracted left 2nd toe  Skin:        Painful skin lesions plantar right forefoot      PHYSICAL EXAM:  General: The patient is alert and oriented x3 in no acute distress.  Dermatology: Skin is warm, dry and supple bilateral lower  extremities. Interspaces are clear of maceration and debris.  No rashes noted.  There are 3 focal spongy hyperkeratotic lesions to the right plantar forefoot between the third and fifth metatarsal heads.  There are black dots within the lesions consistent with verruca.  There is pain on palpation of the lesions.  No surrounding erythema.  There is a hyperkeratotic lesion to the plantar medial aspect of the first metatarsophalangeal joint.  Vascular: Palpable pedal pulses bilaterally. Capillary refill within normal limits.  No appreciable edema.  No erythema or calor.  Neurological: Light touch sensation grossly intact bilateral feet.   Musculoskeletal Exam:  There is a bony medial prominence on the dorsomedial aspect of the 1st metatarsal head of the left foot.  There is pain on palpation of the "bump" in this area.  Lateral deviation of the hallux at the MPJ level.  1st MPJ ROM is decreased.  No crepitus.  Hallux is not fully reducible to a straight position.  There is flexible contracture of the left second toe PIP joint.  RADIOGRAPHIC EXAM (left foot, 3 weightbearing views 09/18/2022):  Increased first intermetatarsal angle 14 degrees.  The first intermetatarsal angle does not appear to be increased from her 2023 x-rays.  Increased hallux abductus angle.  This angle does appear to be slightly increased compared to her 2023 x-rays.  Enlargement of bone at dorsomedial 1st metatarsal head.  Uneven joint space narrowing at the first metatarsophalangeal joint.  Joint space narrowing at the second toe PIP joint with some lateral deviation of the toe at the PIP joint level due to pressure from the hallux.  Slight enlargement of the lateral aspect of the fifth metatarsal head consistent with mild tailor's bunion with increase in soft tissue density in this area.  ASSESSMENT/PLAN OF CARE: 1. Hallux valgus with bunions of left foot   2. Bunion   3. Muscle stiffness   4. Muscle cramp, nocturnal   5. Plantar  fascial fibromatosis   6. Plantar wart     AMB REFERRAL TO PHYSICAL THERAPY  Discussed patient's conditions and possible etiologies today.  Discussed conservative treatment options with patient today, including shoe modification / arch supports, off-loading, cortisone injectione, NSAID topical / oral therapy, and toe splints and shields.  Briefly discussed surgical intervention if conservative options are not successful.  Discussed the postoperative course as well.  Regarding the bilateral leg tightness and discomfort, I encouraged her to keep her appoint with neurology as these types of symptoms are often directly related to lumbosacral issues.  Addressing the more proximal issue may provide relief of her lower extremity symptoms.  In the meantime, we will get her set up for physical therapy referral to address the leg and calf tightness and any lumbosacral issues that they may discover on their initial evaluation.  The option was approved for TENS stimulation as needed.  The plantar warts x 3 on the ball of the right foot were sharply shaved with a sterile #312 blade.  Following this and with the patient's consent the lesions were treated with 2 rounds of Cantharone solution followed by Band-Aids for occlusion.  She will remove this in the next 5 to 6 hours.  Expect pain and possible blistering in 24 to 48 hours but then the lesion should diminish in size and severity.  Recommend follow-up in 3 to 4 weeks for wart recheck if they are not fully resolved.  The patient will check her magnesium dosage at home.  Recommend magnesium glycinate 500 mg nightly as this may help with the overnight calf cramping.  She was fitted for a night splint for the left lower extremity.  This is meant to keep the foot from plantar flexing and possibly aiding in shortening of the Achilles and posterior leg musculature overnight.  Will see if this alleviates her tightness and pain with her for steps out of bed in the  morning.  She was fitted for a left bunion splint that she can wear when she is sleeping to help hold the toe in a corrected position.  She was also fitted for a first interspace Silipos toe spacer to wear during waking hours for ambulation to hold the toe in a better position in shoes.  Patient may follow-up towards the end of physical therapy in 4 to 5 weeks.  She will have already seen neurology and have their treatment plan initiated as well.    Clerance Lav, DPM, FACFAS [facility]     [address], [place] [address]  Office [phone]  Fax [phone]

## 2022-09-28 ENCOUNTER — Other Ambulatory Visit: Payer: Self-pay

## 2022-09-28 ENCOUNTER — Encounter: Payer: Self-pay | Admitting: Rehabilitative and Restorative Service Providers"

## 2022-09-28 ENCOUNTER — Other Ambulatory Visit (HOSPITAL_COMMUNITY): Payer: Self-pay

## 2022-09-28 ENCOUNTER — Ambulatory Visit: Payer: 59 | Attending: Podiatry | Admitting: Rehabilitative and Restorative Service Providers"

## 2022-09-28 DIAGNOSIS — M79662 Pain in left lower leg: Secondary | ICD-10-CM | POA: Diagnosis present

## 2022-09-28 DIAGNOSIS — R252 Cramp and spasm: Secondary | ICD-10-CM | POA: Diagnosis present

## 2022-09-28 DIAGNOSIS — M6289 Other specified disorders of muscle: Secondary | ICD-10-CM | POA: Insufficient documentation

## 2022-09-28 DIAGNOSIS — M6281 Muscle weakness (generalized): Secondary | ICD-10-CM | POA: Insufficient documentation

## 2022-09-28 NOTE — Therapy (Signed)
OUTPATIENT PHYSICAL THERAPY LOWER EXTREMITY EVALUATION   Patient Name: [name] MRN: [id number] DOB:[birth year], 61 y.o., female Today's Date: 09/28/2022  END OF SESSION:  PT End of Session - 09/28/22 1103     Visit Number 1    Number of Visits 8    Date for PT Re-Evaluation 11/27/22    Authorization Type [employer]    PT Start Time 1104    PT Stop Time 1150    PT Time Calculation (min) 46 min    Activity Tolerance Patient tolerated treatment well    Behavior During Therapy [facility] for tasks assessed/performed            History reviewed. No pertinent past medical history. 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   Changing skin lesion 10/08/2019   CAD (coronary artery disease) 06/26/2018   DOE (dyspnea on exertion) 06/26/2018   Bicuspid aortic valve 05/27/2018   Chest discomfort 03/27/2018   Laryngopharyngeal reflux (LPR) 08/20/2017   Ulcerative colitis (HCC) 09/21/2015   Overweight (BMI 25.0-29.9) 09/21/2015   Cellulitis and abscess 09/21/2015   Lymphocytic colitis 10/17/2011   Depression, postpartum 10/17/2011     REFERRING PROVIDER: Lucia Estelle, DPM REFERRING DIAG:  M62.89 (ICD-10-CM) - Muscle stiffness  R25.2 (ICD-10-CM) - Muscle cramp, nocturnal   THERAPY DIAG:  Muscle cramp  Muscle weakness (generalized)  Pain in left lower leg  Rationale for Evaluation and Treatment: Rehabilitation  ONSET DATE:  09/18/22  SUBJECTIVE:   SUBJECTIVE STATEMENT: The patient reports worsening LE pain over the past 6 months. She saw a podiatrist last week and began wearing a night splint to reduce night time cramps and this has been helpful. Pain has been present x 5+ years with known h/o of lumbar spondylolisthesis. The cymbalta has reduced nerve pain in her lower extremities. Low back and hips are not painful-- pain is localized to distal lower extremities and feet. The left LE is more painful than R.  PERTINENT HISTORY: Lumbar spondylolisthesis and lumbar stenosis. PAIN:  Are you having pain? Yes: NPRS scale: 1/10 Pain location: calf Pain description: crampiness and tightness, tingling Aggravating factors:  morning Relieving factors: movement and cymbalta  PRECAUTIONS: None  WEIGHT BEARING RESTRICTIONS: No  FALLS:  Has patient fallen in last 6 months? 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  PLOF: Independent  PATIENT GOALS: Pain in the morning is greatly reduced.  OBJECTIVE:   DIAGNOSTIC FINDINGS: Chronic 4mm facet mediated anterolisthesthesis L4-L5 with moderate to severe bilateral stenosis, descending L5 nerve root level., mild to moderate left greater than right facet hypertrophy at L3-L4 with L lateral recess stenosis  PATIENT SURVEYS:  Not performed.  SENSATION: H/o bilateral LE tingling and heaviness  WEIGHT BEARING:    Patient has arch present in foot when NWB. In standing, she has overpronation. L great toe positioning  is adduction to 34 degrees, R is 12 degrees. (Hallux valgus greater on L side)  LOWER EXTREMITY ROM: tightness bilat calf to neutral PROM. Can achieve greater motion in WB/loading Active ROM Right eval Left eval  Hip flexion WFLs WFLs  Hip extension    Hip abduction    Hip adduction    Hip internal rotation    Hip external rotation    Knee flexion  WFLs WFLs  Knee extension WFLs WFLs  Ankle dorsiflexion 0 0  Ankle plantarflexion    Ankle inversion    Ankle eversion     (Blank rows = not tested)  LOWER EXTREMITY MMT: MMT Right eval Left eval  Hip flexion 5/5 5/5  Hip extension 4/5 4/5  Hip abduction 3+/5 3+/5  Hip adduction    Hip internal rotation    Hip external rotation    Knee flexion 5/5 5/5  Knee extension 5/5 5/5  Ankle dorsiflexion 5/5 5/5  Ankle plantarflexion    Ankle inversion    Ankle eversion     (Blank rows = not tested)  GAIT: Comments: to further assess  [facility] Adult PT Treatment:                                                DATE: 09/28/22 Therapeutic Exercise: Seated Arch lifting Supine Lumbar rocking with wide feet Sidelying Hip abduction x 8 reps Standing Gastroc stretch at wall -- beginning with front knee straight  Self Care: Self mobilization using massage stick for soft tissue tightness and cramping.   PATIENT EDUCATION:  Education details: HEP, PT plan, and self mobilization of soft tissue Person educated: Patient Education method: Explanation, Demonstration, and Handouts Education comprehension: verbalized understanding and returned demonstration  HOME EXERCISE PROGRAM: Access Code: [email or web address] URL: [email or web address] Date: 09/28/2022 Prepared by: Margretta Ditty  Program Notes Roll out calf muscles with massage stick  Exercises - Gastroc Stretch on Wall  - 2 x daily - 7 x weekly - 1 sets - 3 reps - 30 seconds hold - Sidelying Hip Abduction  - 1 x daily - 5 x weekly - 1 sets - 8 reps - Supine Lower Trunk Rotation  - 1 x daily - 7 x weekly - 1 sets - 10 reps - Arch Lifting  - 2 x daily - 5 x weekly - 1 sets - 10 reps  ASSESSMENT:  CLINICAL IMPRESSION: Patient is a 61 y.o. female who was seen today for physical therapy evaluation and treatment for lower leg and foot pain. She has pronounced L hallux valgus, L calf pain, tingling in lower extremities, bilat hip  abduction/extensor weakness, and pain that is worse in the morning. PT initiated HEP and soft tissue mobilization. Patient to focus on LE strengthening and flexbility to reduce pain and optimize current functional status. She participates in yoga, walking and pickle ball for recreation.   OBJECTIVE IMPAIRMENTS: decreased activity tolerance, decreased ROM, decreased strength, impaired flexibility, and pain.   ACTIVITY LIMITATIONS:  morning pain  PARTICIPATION LIMITATIONS:  n/a  PERSONAL FACTORS: 1 comorbidity: lumbar spondylolisthesis  are also affecting patient's functional outcome.   REHAB POTENTIAL: Good  CLINICAL DECISION MAKING: Stable/uncomplicated  EVALUATION COMPLEXITY: Low   GOALS: Goals reviewed with patient? Yes  SHORT TERM GOALS: Target date: 10/28/22 The patient will be indep with HEP. Baseline:initiated at eval Goal status: INITIAL  2.  The  patient will report reduction in pain in calf musculature in the morning by 50%.  Baseline: More pain in the morning, notes cramping that can wake her Goal status: INITIAL  3.  The patient will improve bilat hip strength to 4/5 hip abduction. Baseline:  3+/5 Goal status: INITIAL   LONG TERM GOALS: Target date: 11/27/22  The patient will be indep with progression of HEP. Baseline: initiated at eval Goal status: INITIAL  2.  The patient will improve bilat ankle AROM to 10 degrees DF   Baseline: 0 degrees (maintained at neutral in NWB) Goal status: INITIAL  3.  The patient will report reduction in pain in calf muculature in the morning by 75%. Baseline:  see STG Goal status: INITIAL  4.  The patient will improve bilat hip extension to 5/5  Baseline:  4/5 bilat Goal status: INITIAL  PLAN:  PT FREQUENCY: 1x/week  PT DURATION: 8 weeks  PLANNED INTERVENTIONS: Therapeutic exercises, Therapeutic activity, Neuromuscular re-education, Balance training, Gait training, Patient/Family education, Self Care, Joint mobilization,  Dry Needling, Taping, and Manual therapy  PLAN FOR NEXT SESSION: check HEP, progress to add glut extension exercises, calf raises (if able), consider taping hallux valgus, LE strengthening, flexbility   Ronen Bromwell, PT 09/28/2022, 2:21 PM

## 2022-10-01 ENCOUNTER — Other Ambulatory Visit (HOSPITAL_COMMUNITY): Payer: Self-pay

## 2022-10-03 ENCOUNTER — Ambulatory Visit: Payer: 59 | Admitting: Rehabilitative and Restorative Service Providers"

## 2022-10-03 ENCOUNTER — Encounter: Payer: Self-pay | Admitting: Rehabilitative and Restorative Service Providers"

## 2022-10-03 DIAGNOSIS — M79662 Pain in left lower leg: Secondary | ICD-10-CM

## 2022-10-03 DIAGNOSIS — R252 Cramp and spasm: Secondary | ICD-10-CM | POA: Diagnosis not present

## 2022-10-03 DIAGNOSIS — M6281 Muscle weakness (generalized): Secondary | ICD-10-CM

## 2022-10-03 NOTE — Therapy (Signed)
OUTPATIENT PHYSICAL THERAPY LOWER EXTREMITY TREATMENT  Patient Name: [name] MRN: [id number] DOB:[birth year], 61 y.o., female Today's Date: 10/03/2022  END OF SESSION:  PT End of Session - 10/03/22 1318     Visit Number 2    Number of Visits 8    Date for PT Re-Evaluation 11/27/22    Authorization Type UHC    PT Start Time 1319    PT Stop Time 1400    PT Time Calculation (min) 41 min    Activity Tolerance Patient tolerated treatment well    Behavior During Therapy [facility] for tasks assessed/performed            History reviewed. No pertinent past medical history. 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   Changing skin lesion 10/08/2019   CAD (coronary artery disease) 06/26/2018   DOE (dyspnea on exertion) 06/26/2018   Bicuspid aortic valve 05/27/2018   Chest discomfort 03/27/2018   Laryngopharyngeal reflux (LPR) 08/20/2017   Ulcerative colitis (HCC) 09/21/2015   Overweight (BMI 25.0-29.9) 09/21/2015   Cellulitis and abscess 09/21/2015   Lymphocytic colitis 10/17/2011   Depression, postpartum 10/17/2011   REFERRING PROVIDER: Lucia Estelle, DPM REFERRING DIAG:  M62.89 (ICD-10-CM) - Muscle stiffness  R25.2 (ICD-10-CM) - Muscle cramp, nocturnal   THERAPY DIAG:  Muscle cramp  Muscle weakness (generalized)  Pain in left lower leg  Rationale for Evaluation and Treatment: Rehabilitation  ONSET DATE:  09/18/22  SUBJECTIVE:   SUBJECTIVE STATEMENT: The patient has been doing exercises since last session. She notes hips are sore with sidelying exercises.  EVAL: The patient reports worsening LE pain over the past 6 months. She saw a podiatrist last week and began wearing a night splint to reduce night time cramps and this has been helpful. Pain has been present x 5+ years with known h/o of lumbar spondylolisthesis. The cymbalta has reduced nerve pain in her lower extremities. Low back and hips are not painful-- pain is localized to distal lower extremities and feet. The left LE is more painful than R.  PERTINENT HISTORY: Lumbar spondylolisthesis and lumbar stenosis. L knee surgery PAIN:  Are you having pain? No  PRECAUTIONS: None  WEIGHT BEARING RESTRICTIONS: No  FALLS:  Has patient fallen in last 6 months? No  PATIENT GOALS: Pain in the morning is greatly reduced.  OBJECTIVE:  (Measures in this section from initial evaluation unless otherwise noted) DIAGNOSTIC FINDINGS: Chronic 4mm facet mediated anterolisthesthesis L4-L5 with moderate to severe bilateral stenosis, descending L5 nerve root level., mild to moderate left greater than right facet hypertrophy at L3-L4 with L lateral recess stenosis SENSATION: H/o bilateral LE tingling and heaviness WEIGHT BEARING:    Patient has arch present in foot when NWB. In standing, she has overpronation. L great toe positioning is adduction to 34 degrees, R is 12 degrees. (Hallux valgus greater on L side) LOWER EXTREMITY ROM: tightness bilat calf to neutral PROM. Can  achieve greater motion in WB/loading Active ROM Right eval Left eval  Hip flexion WFLs WFLs  Hip extension    Hip abduction    Hip adduction    Hip internal rotation    Hip external rotation    Knee flexion WFLs WFLs  Knee extension WFLs WFLs  Ankle dorsiflexion 0 0  Ankle plantarflexion    Ankle inversion    Ankle eversion     (Blank rows = not tested) LOWER EXTREMITY  MMT: MMT Right eval Left eval  Hip flexion 5/5 5/5  Hip extension 4/5 4/5  Hip abduction 3+/5 3+/5  Hip adduction    Hip internal rotation    Hip external rotation    Knee flexion 5/5 5/5  Knee extension 5/5 5/5  Ankle dorsiflexion 5/5 5/5  Ankle plantarflexion    Ankle inversion    Ankle eversion     (Blank rows = not tested) GAIT: Comments: to further assess  [facility] Adult PT Treatment:                                                DATE: 10/03/22 Therapeutic Exercise: Supine Lumbar rocking x 10 reps SLR x 5 reps R and L sides Bridge with hip abduction into band x 10 reps Seated Great toe extension x R and L sides Standing Toe yoga lifting lateral/medially Heel raises  no shoes with cues for toe pressure into ground x 10-15 reps Sit to stand/squat to table touch x 8 reps -- discussed using gym leg press in place of this activity Prone Hip extension x 10 reps R and L sides Sidelying Hip abduction x 10 reps R and L  [facility] Adult PT Treatment:                                                DATE: 09/28/22 Therapeutic Exercise: Seated Arch lifting Supine Lumbar rocking with wide feet Sidelying Hip abduction x 8 reps Standing Gastroc stretch at wall -- beginning with front knee straight  Self Care: Self mobilization using massage stick for soft tissue tightness and cramping.   PATIENT EDUCATION:  Education details: HEP, PT plan, and self mobilization of soft tissue Person educated: Patient Education method: Explanation, Demonstration, and Handouts Education comprehension: verbalized understanding and returned demonstration  HOME EXERCISE PROGRAM: Access Code: [email or web address] URL: [email or web address] Date: 09/28/2022 Prepared by: Margretta Ditty  Program Notes Roll out calf muscles with massage stick  Exercises - Gastroc Stretch on Wall  - 2 x daily - 7 x weekly - 1 sets - 3 reps - 30 seconds hold - Sidelying Hip Abduction  - 1 x daily - 5 x weekly - 1  sets - 8 reps - Supine Lower Trunk Rotation  - 1 x daily - 7 x weekly - 1 sets - 10 reps - Arch Lifting  - 2 x daily - 5 x weekly - 1 sets - 10 reps  ASSESSMENT:  CLINICAL IMPRESSION: The patient has responded well to home exercises and brace use. She is noting less cramping and pain. Plan to continue working to LTGs.   EVAL:Patient is a 62 y.o. female who was seen today for physical therapy evaluation and treatment for lower leg and foot pain.  She has pronounced L hallux valgus, L calf pain, tingling in lower extremities, bilat hip abduction/extensor weakness, and pain that is worse in the morning. PT initiated HEP and soft tissue mobilization. Patient to focus on LE strengthening and flexbility to reduce pain and optimize current functional status. She participates in yoga, walking and pickle ball for recreation.   OBJECTIVE IMPAIRMENTS: decreased activity tolerance, decreased ROM, decreased strength, impaired flexibility, and pain.   GOALS: Goals reviewed with patient? Yes  SHORT TERM GOALS: Target date: 10/28/22 The patient will be indep with HEP. Baseline:initiated at eval Goal status: INITIAL  2.  The patient will report reduction in pain in calf musculature in the morning by 50%.  Baseline: More pain in the morning, notes cramping that can wake her Goal status: INITIAL  3.  The patient will improve bilat hip strength to 4/5 hip abduction. Baseline:  3+/5 Goal status: INITIAL   LONG TERM GOALS: Target date: 11/27/22  The patient will be indep with progression of HEP. Baseline: initiated at eval Goal status: INITIAL  2.  The patient will improve bilat ankle AROM to 10 degrees DF   Baseline: 0 degrees (maintained at neutral in NWB) Goal status: INITIAL  3.  The patient will report reduction in pain in calf muculature in the morning by 75%. Baseline:  see STG Goal status: INITIAL  4.  The patient will improve bilat hip extension to 5/5  Baseline:  4/5 bilat Goal status:  INITIAL  PLAN:  PT FREQUENCY: 1x/week  PT DURATION: 8 weeks  PLANNED INTERVENTIONS: Therapeutic exercises, Therapeutic activity, Neuromuscular re-education, Balance training, Gait training, Patient/Family education, Self Care, Joint mobilization, Dry Needling, Taping, and Manual therapy  PLAN FOR NEXT SESSION: check HEP, foot specific activities, standing balance for hip/foot.  TRUE Garciamartinez, PT 10/03/2022, 1:18 PM

## 2022-10-10 ENCOUNTER — Encounter: Payer: Self-pay | Admitting: Rehabilitative and Restorative Service Providers"

## 2022-10-10 ENCOUNTER — Ambulatory Visit: Payer: 59 | Admitting: Rehabilitative and Restorative Service Providers"

## 2022-10-10 DIAGNOSIS — M6281 Muscle weakness (generalized): Secondary | ICD-10-CM

## 2022-10-10 DIAGNOSIS — R252 Cramp and spasm: Secondary | ICD-10-CM

## 2022-10-10 DIAGNOSIS — M79662 Pain in left lower leg: Secondary | ICD-10-CM

## 2022-10-10 NOTE — Therapy (Addendum)
 OUTPATIENT PHYSICAL THERAPY LOWER EXTREMITY TREATMENT / DISCHARGE SUMMARY  Patient Name: FLOR WHITACRE MRN: [id number] DOB:15-Apr-1961, 62 y.o., female Today's Date: 10/10/2022   Patient cancelled remaining visits due to new injury. See note below for last known patient status.   END OF SESSION:  PT End of Session - 10/10/22 1113     Visit Number 3    Number of Visits 8    Date for PT Re-Evaluation 11/27/22    Authorization Type UHC    PT Start Time 1110    PT Stop Time 1150    PT Time Calculation (min) 40 min    Activity Tolerance Patient tolerated treatment well    Behavior During Therapy WFL for tasks assessed/performed            History reviewed. No pertinent past medical history. 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   Changing skin lesion 10/08/2019   CAD (coronary artery disease) 06/26/2018   DOE (dyspnea on exertion) 06/26/2018   Bicuspid aortic valve 05/27/2018   Chest discomfort 03/27/2018   Laryngopharyngeal reflux (LPR) 08/20/2017   Ulcerative colitis (HCC) 09/21/2015   Overweight (BMI 25.0-29.9) 09/21/2015   Cellulitis and abscess 09/21/2015   Lymphocytic colitis 10/17/2011   Depression, postpartum 10/17/2011   REFERRING PROVIDER: Lucia Estelle, DPM REFERRING DIAG:  M62.89 (ICD-10-CM) - Muscle stiffness  R25.2 (ICD-10-CM) - Muscle cramp, nocturnal   THERAPY DIAG:  No diagnosis found.  Rationale  for Evaluation and Treatment: Rehabilitation  ONSET DATE: 09/18/22  SUBJECTIVE:   SUBJECTIVE STATEMENT: The patient is not having any pain anywhere. She did get one leg cramp since last session.  The patient has been doing exercises since last session. She notes hips are sore with sidelying exercises.  EVAL: The patient reports worsening LE pain over the past 6 months. She saw a podiatrist last week and began wearing a night splint to reduce night time cramps and this has been helpful. Pain has been present x 5+ years with known h/o of lumbar spondylolisthesis. The cymbalta has reduced nerve pain in her lower extremities. Low back and hips are not painful-- pain is localized to distal lower extremities and feet. The left LE is more painful than R.  PERTINENT HISTORY: Lumbar spondylolisthesis and lumbar stenosis. L knee surgery PAIN:  Are you having pain? No  PRECAUTIONS: None  WEIGHT BEARING RESTRICTIONS: No  FALLS:  Has patient fallen in last 6 months? No  PATIENT GOALS: Pain in the morning is greatly reduced.  OBJECTIVE:  (Measures in this section from initial evaluation unless otherwise noted) DIAGNOSTIC FINDINGS: Chronic 4mm facet mediated anterolisthesthesis L4-L5 with moderate to severe bilateral stenosis, descending L5 nerve root level., mild to moderate left greater than right facet hypertrophy at L3-L4 with L lateral recess stenosis SENSATION: H/o bilateral LE tingling and heaviness WEIGHT BEARING:    Patient has arch present in foot when NWB. In standing, she  has overpronation. L great toe positioning is adduction to 34 degrees, R is 12 degrees. (Hallux valgus greater on L side) LOWER EXTREMITY ROM: tightness bilat calf to neutral PROM. Can achieve greater motion in WB/loading Active ROM Right eval Left eval  Hip flexion WFLs WFLs  Hip extension    Hip abduction    Hip adduction    Hip internal rotation    Hip external rotation    Knee flexion WFLs WFLs  Knee  extension WFLs WFLs  Ankle dorsiflexion 0 0  Ankle plantarflexion    Ankle inversion    Ankle eversion     (Blank rows = not tested) LOWER EXTREMITY MMT: MMT Right eval Left eval  Hip flexion 5/5 5/5  Hip extension 4/5 4/5  Hip abduction 3+/5 3+/5  Hip adduction    Hip internal rotation    Hip external rotation    Knee flexion 5/5 5/5  Knee extension 5/5 5/5  Ankle dorsiflexion 5/5 5/5  Ankle plantarflexion    Ankle inversion    Ankle eversion     (Blank rows = not tested) GAIT: Comments: to further assess  [facility] Adult PT Treatment:                                                DATE: 10/10/22 Therapeutic Exercise: Arch shortening + great toe extension Toe spreading Ball squeeze with isometrics encouraging adduction great toe Yoga block standing for arch stabilization (do with toe spreader in place and without) Self Care: Muscle roller-- provided info on our clinic one Discussed other HEP and going well  [facility] Adult PT Treatment:                                                DATE: 10/03/22 Therapeutic Exercise: Supine Lumbar rocking x 10 reps SLR x 5 reps R and L sides Bridge with hip abduction into band x 10 reps Seated Great toe extension x R and L sides Standing Toe yoga lifting lateral/medially Heel raises  no shoes with cues for toe pressure into ground x 10-15 reps Sit to stand/squat to table touch x 8 reps -- discussed using gym leg press in place of this activity Prone Hip extension x 10 reps R and L sides Sidelying Hip abduction x 10 reps R and L  [facility] Adult PT Treatment:                                                DATE: 09/28/22 Therapeutic Exercise: Seated Arch lifting Supine Lumbar rocking with wide feet Sidelying Hip abduction x 8 reps Standing Gastroc stretch at wall -- beginning with front knee straight  Self Care: Self mobilization using massage stick for soft tissue tightness and cramping.   PATIENT EDUCATION:  Education details: HEP,  PT plan, and self mobilization of soft tissue Person educated: Patient Education method: Explanation, Demonstration, and Handouts Education comprehension: verbalized understanding and returned demonstration  HOME EXERCISE PROGRAM: Access Code: [email or web address] URL: [email or web address] Date: 09/28/2022 Prepared by: Margretta Ditty  Program Notes Roll out calf muscles with massage stick  Exercises -  Gastroc Stretch on Wall  - 2 x daily - 7 x weekly - 1 sets - 3 reps - 30 seconds hold - Sidelying Hip Abduction  - 1 x daily - 5 x weekly - 1 sets - 8 reps - Supine Lower Trunk Rotation  - 1 x daily - 7 x weekly - 1 sets - 10 reps - Arch Lifting  - 2 x daily - 5 x weekly - 1 sets - 10 reps  ASSESSMENT:  CLINICAL IMPRESSION: The patient has met 2/3 STGs. Patient has comprehensive home exercises and is compliant with performance. Plan to see in 2-3 weeks for next visit to give time to progress current program. Plan to continue working to LTGs.   EVAL:Patient is a 62 y.o. female who was seen today for physical therapy evaluation and treatment for lower leg and foot pain. She has pronounced L hallux valgus, L calf pain, tingling in lower extremities, bilat hip abduction/extensor weakness, and pain that is worse in the morning. PT initiated HEP and soft tissue mobilization. Patient to focus on LE strengthening and flexbility to reduce pain and optimize current functional status. She participates in yoga, walking and pickle ball for recreation.   OBJECTIVE IMPAIRMENTS: decreased activity tolerance, decreased ROM, decreased strength, impaired flexibility, and pain.   GOALS: Goals reviewed with patient? Yes  SHORT TERM GOALS: Target date: 10/28/22 The patient will be indep with HEP. Baseline:initiated at eval Goal status: MET  2.  The patient will report reduction in pain in calf musculature in the morning by 50%.  Baseline: More pain in the morning, notes cramping that can wake  her Goal status: MET <50% reduction  3.  The patient will improve bilat hip strength to 4/5 hip abduction. Baseline:  3+/5 Goal status: INITIAL   LONG TERM GOALS: Target date: 11/27/22  The patient will be indep with progression of HEP. Baseline: initiated at eval Goal status: INITIAL  2.  The patient will improve bilat ankle AROM to 10 degrees DF   Baseline: 0 degrees (maintained at neutral in NWB) Goal status: INITIAL  3.  The patient will report reduction in pain in calf muculature in the morning by 75%. Baseline:  see STG Goal status: INITIAL  4.  The patient will improve bilat hip extension to 5/5  Baseline:  4/5 bilat Goal status: INITIAL  PLAN:  PT FREQUENCY: 1x/week  PT DURATION: 8 weeks  PLANNED INTERVENTIONS: Therapeutic exercises, Therapeutic activity, Neuromuscular re-education, Balance training, Gait training, Patient/Family education, Self Care, Joint mobilization, Dry Needling, Taping, and Manual therapy  PLAN FOR NEXT SESSION: check HEP and progress as indicated.  Tarrie Mcmichen, PT 10/10/2022, 1:51 PM

## 2022-10-15 ENCOUNTER — Other Ambulatory Visit (HOSPITAL_BASED_OUTPATIENT_CLINIC_OR_DEPARTMENT_OTHER): Payer: Self-pay

## 2022-10-15 ENCOUNTER — Other Ambulatory Visit (HOSPITAL_COMMUNITY): Payer: Self-pay

## 2022-10-15 ENCOUNTER — Other Ambulatory Visit: Payer: Self-pay

## 2022-10-17 ENCOUNTER — Encounter: Payer: 59 | Admitting: Rehabilitative and Restorative Service Providers"

## 2022-10-18 ENCOUNTER — Other Ambulatory Visit (HOSPITAL_COMMUNITY): Payer: Self-pay

## 2022-10-18 MED ORDER — TRAZODONE HCL 50 MG PO TABS
50.0000 mg | ORAL_TABLET | Freq: Every day | ORAL | 3 refills | Status: DC
  Filled 2022-10-18 – 2023-01-28 (×2): qty 270, 90d supply, fill #0
  Filled 2023-04-26 – 2023-07-09 (×3): qty 270, 90d supply, fill #1

## 2022-10-18 MED ORDER — DULOXETINE HCL 30 MG PO CPEP
ORAL_CAPSULE | ORAL | 3 refills | Status: DC
  Filled 2022-10-18 – 2023-01-17 (×2): qty 180, 90d supply, fill #0
  Filled 2023-04-19: qty 180, 90d supply, fill #1
  Filled 2023-07-08 – 2023-07-09 (×2): qty 180, 90d supply, fill #2

## 2022-10-24 ENCOUNTER — Encounter: Payer: 59 | Admitting: Rehabilitative and Restorative Service Providers"

## 2022-11-05 ENCOUNTER — Ambulatory Visit: Payer: 59 | Admitting: Rehabilitative and Restorative Service Providers"

## 2023-01-17 ENCOUNTER — Other Ambulatory Visit (HOSPITAL_COMMUNITY): Payer: Self-pay

## 2023-01-18 ENCOUNTER — Other Ambulatory Visit (HOSPITAL_COMMUNITY): Payer: Self-pay

## 2023-01-29 ENCOUNTER — Other Ambulatory Visit: Payer: Self-pay

## 2023-01-29 ENCOUNTER — Other Ambulatory Visit (HOSPITAL_COMMUNITY): Payer: Self-pay

## 2023-02-12 ENCOUNTER — Other Ambulatory Visit: Payer: Self-pay

## 2023-02-12 MED ORDER — INFLUENZA VIRUS VACC SPLIT PF (FLUZONE) 0.5 ML IM SUSY
0.5000 mL | PREFILLED_SYRINGE | Freq: Once | INTRAMUSCULAR | 0 refills | Status: AC
  Filled 2023-02-12: qty 0.5, 1d supply, fill #0

## 2023-04-19 ENCOUNTER — Other Ambulatory Visit: Payer: Self-pay

## 2023-04-19 ENCOUNTER — Other Ambulatory Visit (HOSPITAL_COMMUNITY): Payer: Self-pay

## 2023-04-20 ENCOUNTER — Other Ambulatory Visit (HOSPITAL_COMMUNITY): Payer: Self-pay

## 2023-05-16 ENCOUNTER — Other Ambulatory Visit: Payer: Self-pay

## 2023-05-17 ENCOUNTER — Other Ambulatory Visit: Payer: Self-pay

## 2023-05-17 ENCOUNTER — Encounter: Payer: Self-pay | Admitting: Pharmacist

## 2023-05-22 ENCOUNTER — Other Ambulatory Visit: Payer: Self-pay

## 2023-07-08 ENCOUNTER — Other Ambulatory Visit (HOSPITAL_COMMUNITY): Payer: Self-pay

## 2023-07-09 ENCOUNTER — Other Ambulatory Visit (HOSPITAL_COMMUNITY): Payer: Self-pay

## 2023-07-09 ENCOUNTER — Other Ambulatory Visit (HOSPITAL_BASED_OUTPATIENT_CLINIC_OR_DEPARTMENT_OTHER): Payer: Self-pay

## 2023-08-06 DIAGNOSIS — Z719 Counseling, unspecified: Secondary | ICD-10-CM

## 2023-08-08 ENCOUNTER — Telehealth: Payer: Self-pay

## 2023-08-08 NOTE — Telephone Encounter (Signed)
 LVM to schedule patient on 09/03/2023 with Dr. Orin Birk. Anytime on this day will work as Economist. Patient will be coming in as a Consult.

## 2023-08-12 ENCOUNTER — Institutional Professional Consult (permissible substitution): Admitting: Radiation Oncology

## 2023-08-27 ENCOUNTER — Other Ambulatory Visit: Payer: Self-pay | Admitting: *Deleted

## 2023-08-27 ENCOUNTER — Encounter: Payer: Self-pay | Admitting: Radiation Oncology

## 2023-08-27 ENCOUNTER — Ambulatory Visit
Admission: RE | Admit: 2023-08-27 | Discharge: 2023-08-27 | Disposition: A | Discharge status: Home / Self Care | Source: Ambulatory Visit | Attending: Radiation Oncology | Admitting: Radiation Oncology

## 2023-08-27 VITALS — BP 118/75 | HR 76 | Resp 14 | Ht 67.0 in | Wt 201.7 lb

## 2023-08-27 DIAGNOSIS — M1712 Unilateral primary osteoarthritis, left knee: Secondary | ICD-10-CM | POA: Diagnosis present

## 2023-08-27 NOTE — Consult Note (Signed)
 NEW PATIENT EVALUATION  Name: [name]  MRN: [id number]  Date:   08/27/2023     DOB: 1960/10/16   This 63 y.o. female patient presents to the clinic for initial evaluation of osteoarthritis and chondromalacia of the left knee with history of microfracture in 2009 to be evaluated for treatment of her osteoarthritis.  REFERRING PHYSICIAN: Dayne Even, MD  CHIEF COMPLAINT:  Chief Complaint  Patient presents with   Consult    DIAGNOSIS: The encounter diagnosis was Unilateral primary osteoarthritis, left knee.   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  She has been treated with numerous injections over the years receiving her last injection of steroids back in July 2024.  She recently has noted increased pain and swelling the left knee and she has had multiple aspirations as well as steroid knee injections.  She continues to have mild swelling of the left knee.  She is seen today for consultation regarding radiation therapy of her osteoarthritis.  PLANNED TREATMENT REGIMEN: MRI scan followed by XRT  PAST MEDICAL HISTORY:  has no past medical history on file.    PAST SURGICAL HISTORY:  Past Surgical History:  Procedure Laterality Date   APPENDECTOMY     CHOLECYSTECTOMY     ENDOMETRIAL ABLATION     LAPAROTOMY     laproscopic kne     TONSILLECTOMY      FAMILY HISTORY: family history includes Diabetes in her brother; Goiter in her mother; Heart attack in her mother; Heart disease in her father; Heart failure in her father; Irregular heart beat in her father and mother; Thyroid  disease in her father.  SOCIAL HISTORY:  reports that she quit smoking about 9 years ago. Her smoking use included cigarettes. She has never used smokeless tobacco. She reports current alcohol  use. She reports that she does not use drugs.  ALLERGIES: Patient has no known allergies.  MEDICATIONS:  Current Outpatient Medications  Medication Sig Dispense Refill   DULoxetine  (CYMBALTA ) 30 MG capsule Take 1 capsule by mouth twice a day 180 capsule 3   fluorouracil  (EFUDEX ) 5 % cream Apply topically 2 (two) times daily. 40 g 2   mesalamine  (CANASA ) 1000 MG suppository Place 1 suppository (1,000 mg total) rectally at bedtime. 90 suppository 4   mesalamine  (LIALDA ) 1.2 g EC tablet Take 2 tablets (2.4 g total) by mouth 2 (two) times daily. 360 tablet 4   traZODone  (DESYREL ) 50 MG tablet Take 1 - 3 tablets by mouth at bedtime as needed 270 tablet 3   influenza vac split quadrivalent PF (FLUARIX) 0.5 ML injection Inject into the muscle. (Patient not taking: Reported on 09/28/2022) 0.5 mL 0   mesalamine  (LIALDA ) 1.2 g EC tablet Take 2 tablets by mouth daily with breakfast.     polyethylene glycol (GOLYTELY ) 236 g solution Use as directed 4000 mL 0   Probiotic Product (PROBIOTIC DAILY PO) Take 1 tablet by mouth daily.     No current facility-administered medications for this encounter.    ECOG PERFORMANCE STATUS:  1 - Symptomatic but completely ambulatory  REVIEW OF SYSTEMS: Patient denies any weight loss, fatigue, weakness, fever, chills or night sweats. Patient denies any loss of vision, blurred vision. Patient denies any ringing  of the ears or hearing loss. No irregular heartbeat. Patient denies heart murmur or history  of fainting. Patient denies any chest pain or pain radiating to her upper extremities. Patient denies any shortness of breath, difficulty breathing at night, cough or hemoptysis. Patient denies any swelling in the lower legs. Patient denies any nausea vomiting, vomiting of blood, or coffee ground material in the vomitus. Patient denies any stomach pain. Patient states has had normal bowel movements no significant constipation or diarrhea. Patient denies any dysuria, hematuria or  significant nocturia. Patient denies any problems walking, swelling in the joints or loss of balance. Patient denies any skin changes, loss of hair or loss of weight. Patient denies any excessive worrying or anxiety or significant depression. Patient denies any problems with insomnia. Patient denies excessive thirst, polyuria, polydipsia. Patient denies any swollen glands, patient denies easy bruising or easy bleeding. Patient denies any recent infections, allergies or URI. Patient "s visual fields have not changed significantly in recent time.   PHYSICAL EXAM: BP 118/75   Pulse 76   Resp 14   Ht 5\' 7"  (1.702 m)   Wt 201 lb 11.2 oz (91.5 kg)   BMI 31.59 kg/m  Range of motion of the left knee does not elicit pain.  Motor and sensory levels are equal and symmetric in the lower extremities proprioception is intact.  LABORATORY DATA: No current laboratory data    RADIOLOGY RESULTS: MRI scan ordered   IMPRESSION: Longstanding osteoarthritis of the left knee status post multiple injections and aspirations over the years now for consideration of XRT  PLAN: At this time I have ordered an MRI scan of her left knee just to rule out any possibility of possible microfracture or other other pathology in the knee.  Should that show no significant signs of pathology we will proceed with XRT to her left knee.  Would plan on delivering 3 Gray in 6 fractions at 0.5 [place] per fraction.  Treatments will be 2/week.  Risks and benefits of treatment including extreme low side effect profile with almost no chance of skin reaction or further pathology from this low-dose radiation.  Patient comprehensive recommendations well we will see her 5 days after MRI scan and if things look positive we will proceed with simulation that day.  I would like to take this opportunity to thank you for allowing me to participate in the care of your patient.Glenis Langdon, MD

## 2023-09-03 ENCOUNTER — Encounter: Payer: Self-pay | Admitting: Plastic Surgery

## 2023-09-03 ENCOUNTER — Ambulatory Visit (INDEPENDENT_AMBULATORY_CARE_PROVIDER_SITE_OTHER): Payer: Self-pay | Admitting: Plastic Surgery

## 2023-09-03 VITALS — BP 113/75 | HR 89 | Ht 67.0 in | Wt 195.0 lb

## 2023-09-03 DIAGNOSIS — L989 Disorder of the skin and subcutaneous tissue, unspecified: Secondary | ICD-10-CM

## 2023-09-03 NOTE — Progress Notes (Signed)
   Subjective:    Patient ID: [name], female    DOB: 1961-03-16, 63 y.o.   MRN: [id number]  The patient is a 63 year old female here for evaluation of her face.  She has several areas that she is concerned about.  She has not had cancer in the past but she does have changing skin lesions that have been concerning.  She has a flesh-colored lesion on her right cheek and a similar looking 1 on her chin.  These are changing a little bit and starting to bother her.  There are about 5 to 7 mm in size.  She has a little changing skin lesion on her forehead that looks to be a seborrheic keratosis and then 2 scars 1 is on her forehead and ones on her nose.  She has 2 changing skin lesions on her left posterior leg and she is planning on showing her dermatologist those.      Review of Systems  Constitutional: Negative.   Eyes: Negative.   Respiratory: Negative.    Cardiovascular: Negative.   Gastrointestinal: Negative.   Endocrine: Negative.   Genitourinary: Negative.        Objective:   Physical Exam Vitals reviewed.  Constitutional:      Appearance: Normal appearance.  HENT:     Head: Atraumatic.  Cardiovascular:     Rate and Rhythm: Normal rate.     Pulses: Normal pulses.  Skin:    General: Skin is warm.     Capillary Refill: Capillary refill takes less than 2 seconds.  Neurological:     Mental Status: She is alert and oriented to person, place, and time.  Psychiatric:        Mood and Affect: Mood normal.        Behavior: Behavior normal.        Thought Content: Thought content normal.        Judgment: Judgment normal.        Assessment & Plan:     ICD-10-CM   1. Changing skin lesion  L98.9       Plan for excision of changing skin lesion on right cheek chin with path evaluation.  TRL laser to forehead and nose.  Pictures were obtained of the patient and placed in the chart with the patient's or guardian's permission.

## 2023-09-05 ENCOUNTER — Ambulatory Visit (HOSPITAL_COMMUNITY)
Admission: RE | Admit: 2023-09-05 | Discharge: 2023-09-05 | Disposition: A | Discharge status: Home / Self Care | Source: Ambulatory Visit | Attending: Radiation Oncology | Admitting: Radiation Oncology

## 2023-09-05 DIAGNOSIS — M1712 Unilateral primary osteoarthritis, left knee: Secondary | ICD-10-CM | POA: Diagnosis present

## 2023-09-10 ENCOUNTER — Ambulatory Visit: Admission: RE | Admit: 2023-09-10 | Source: Ambulatory Visit | Admitting: Radiation Oncology

## 2023-09-10 ENCOUNTER — Ambulatory Visit
Admission: RE | Admit: 2023-09-10 | Discharge: 2023-09-10 | Disposition: A | Discharge status: Home / Self Care | Source: Ambulatory Visit | Attending: Radiation Oncology | Admitting: Radiation Oncology

## 2023-09-10 ENCOUNTER — Inpatient Hospital Stay
Admission: RE | Admit: 2023-09-10 | Discharge: 2023-09-10 | Disposition: A | Discharge status: Home / Self Care | Source: Ambulatory Visit | Attending: Radiation Oncology | Admitting: Radiation Oncology

## 2023-09-10 DIAGNOSIS — M1712 Unilateral primary osteoarthritis, left knee: Secondary | ICD-10-CM | POA: Insufficient documentation

## 2023-09-17 ENCOUNTER — Ambulatory Visit

## 2023-09-19 ENCOUNTER — Ambulatory Visit

## 2023-09-23 ENCOUNTER — Ambulatory Visit

## 2023-09-24 DIAGNOSIS — M1712 Unilateral primary osteoarthritis, left knee: Secondary | ICD-10-CM | POA: Insufficient documentation

## 2023-09-25 ENCOUNTER — Ambulatory Visit

## 2023-10-01 ENCOUNTER — Ambulatory Visit

## 2023-10-03 ENCOUNTER — Ambulatory Visit

## 2023-10-08 ENCOUNTER — Ambulatory Visit

## 2023-10-10 ENCOUNTER — Ambulatory Visit

## 2023-10-14 ENCOUNTER — Ambulatory Visit
Admission: RE | Admit: 2023-10-14 | Discharge: 2023-10-14 | Disposition: A | Discharge status: Home / Self Care | Source: Ambulatory Visit | Attending: Radiation Oncology | Admitting: Radiation Oncology

## 2023-10-14 ENCOUNTER — Ambulatory Visit (INDEPENDENT_AMBULATORY_CARE_PROVIDER_SITE_OTHER): Payer: Self-pay

## 2023-10-14 ENCOUNTER — Other Ambulatory Visit: Payer: Self-pay

## 2023-10-14 DIAGNOSIS — M1712 Unilateral primary osteoarthritis, left knee: Secondary | ICD-10-CM | POA: Diagnosis not present

## 2023-10-14 DIAGNOSIS — Z719 Counseling, unspecified: Secondary | ICD-10-CM

## 2023-10-14 LAB — RAD ONC ARIA SESSION SUMMARY
Course Elapsed Days: 0
Plan Fractions Treated to Date: 1
Plan Prescribed Dose Per Fraction: 0.5 Gy
Plan Total Fractions Prescribed: 6
Plan Total Prescribed Dose: 3 Gy
Reference Point Dosage Given to Date: 0.5 Gy
Reference Point Session Dosage Given: 0.5 Gy
Session Number: 1

## 2023-10-16 ENCOUNTER — Other Ambulatory Visit: Payer: Self-pay

## 2023-10-16 ENCOUNTER — Ambulatory Visit
Admission: RE | Admit: 2023-10-16 | Discharge: 2023-10-16 | Disposition: A | Discharge status: Home / Self Care | Source: Ambulatory Visit | Attending: Radiation Oncology | Admitting: Radiation Oncology

## 2023-10-16 DIAGNOSIS — M1712 Unilateral primary osteoarthritis, left knee: Secondary | ICD-10-CM | POA: Diagnosis not present

## 2023-10-16 LAB — RAD ONC ARIA SESSION SUMMARY
Course Elapsed Days: 2
Plan Fractions Treated to Date: 2
Plan Prescribed Dose Per Fraction: 0.5 Gy
Plan Total Fractions Prescribed: 6
Plan Total Prescribed Dose: 3 Gy
Reference Point Dosage Given to Date: 1 Gy
Reference Point Session Dosage Given: 0.5 Gy
Session Number: 2

## 2023-10-21 ENCOUNTER — Other Ambulatory Visit (HOSPITAL_COMMUNITY): Payer: Self-pay

## 2023-10-21 MED ORDER — DULOXETINE HCL 30 MG PO CPEP
30.0000 mg | ORAL_CAPSULE | Freq: Two times a day (BID) | ORAL | 0 refills | Status: AC
  Filled 2023-10-21: qty 180, 90d supply, fill #0

## 2023-10-22 ENCOUNTER — Other Ambulatory Visit: Payer: Self-pay

## 2023-10-22 ENCOUNTER — Ambulatory Visit
Admission: RE | Admit: 2023-10-22 | Discharge: 2023-10-22 | Disposition: A | Discharge status: Home / Self Care | Source: Ambulatory Visit | Attending: Radiation Oncology | Admitting: Radiation Oncology

## 2023-10-22 ENCOUNTER — Other Ambulatory Visit (HOSPITAL_COMMUNITY): Payer: Self-pay

## 2023-10-22 ENCOUNTER — Ambulatory Visit (INDEPENDENT_AMBULATORY_CARE_PROVIDER_SITE_OTHER): Payer: Self-pay | Admitting: Plastic Surgery

## 2023-10-22 DIAGNOSIS — M1712 Unilateral primary osteoarthritis, left knee: Secondary | ICD-10-CM | POA: Insufficient documentation

## 2023-10-22 DIAGNOSIS — Z719 Counseling, unspecified: Secondary | ICD-10-CM

## 2023-10-22 LAB — RAD ONC ARIA SESSION SUMMARY
Course Elapsed Days: 8
Plan Fractions Treated to Date: 3
Plan Prescribed Dose Per Fraction: 0.5 Gy
Plan Total Fractions Prescribed: 6
Plan Total Prescribed Dose: 3 Gy
Reference Point Dosage Given to Date: 1.5 Gy
Reference Point Session Dosage Given: 0.5 Gy
Session Number: 3

## 2023-10-22 NOTE — Progress Notes (Signed)
 Preoperative Dx: skin lesions of face  Postoperative Dx:  same  Procedure: laser to face   Anesthesia: none  Description of Procedure:  Risks and complications were explained to the patient. Consent was confirmed and signed. Eye protection was placed. Time out was called and all information was confirmed to be correct. The area  area was prepped with alcohol and wiped dry. The TRL laser was set at 30. The nose and forehead were lasered. The patient tolerated the procedure well and there were no complications. The patient is to follow up in 4 weeks.

## 2023-10-23 ENCOUNTER — Other Ambulatory Visit (HOSPITAL_COMMUNITY): Payer: Self-pay

## 2023-10-24 ENCOUNTER — Other Ambulatory Visit: Payer: Self-pay

## 2023-10-24 ENCOUNTER — Ambulatory Visit
Admission: RE | Admit: 2023-10-24 | Discharge: 2023-10-24 | Disposition: A | Discharge status: Home / Self Care | Source: Ambulatory Visit | Attending: Radiation Oncology | Admitting: Radiation Oncology

## 2023-10-24 ENCOUNTER — Ambulatory Visit

## 2023-10-24 DIAGNOSIS — M1712 Unilateral primary osteoarthritis, left knee: Secondary | ICD-10-CM | POA: Diagnosis not present

## 2023-10-24 LAB — RAD ONC ARIA SESSION SUMMARY
Course Elapsed Days: 10
Plan Fractions Treated to Date: 4
Plan Prescribed Dose Per Fraction: 0.5 Gy
Plan Total Fractions Prescribed: 6
Plan Total Prescribed Dose: 3 Gy
Reference Point Dosage Given to Date: 2 Gy
Reference Point Session Dosage Given: 0.5 Gy
Session Number: 4

## 2023-10-29 ENCOUNTER — Ambulatory Visit
Admission: RE | Admit: 2023-10-29 | Discharge: 2023-10-29 | Disposition: A | Discharge status: Home / Self Care | Source: Ambulatory Visit | Attending: Radiation Oncology | Admitting: Radiation Oncology

## 2023-10-29 ENCOUNTER — Other Ambulatory Visit: Payer: Self-pay

## 2023-10-29 DIAGNOSIS — M1712 Unilateral primary osteoarthritis, left knee: Secondary | ICD-10-CM | POA: Diagnosis not present

## 2023-10-29 LAB — RAD ONC ARIA SESSION SUMMARY
Course Elapsed Days: 15
Plan Fractions Treated to Date: 5
Plan Prescribed Dose Per Fraction: 0.5 Gy
Plan Total Fractions Prescribed: 6
Plan Total Prescribed Dose: 3 Gy
Reference Point Dosage Given to Date: 2.5 Gy
Reference Point Session Dosage Given: 0.5 Gy
Session Number: 5

## 2023-10-31 ENCOUNTER — Ambulatory Visit
Admission: RE | Admit: 2023-10-31 | Discharge: 2023-10-31 | Disposition: A | Discharge status: Home / Self Care | Source: Ambulatory Visit | Attending: Radiation Oncology | Admitting: Radiation Oncology

## 2023-10-31 ENCOUNTER — Other Ambulatory Visit: Payer: Self-pay

## 2023-10-31 DIAGNOSIS — M1712 Unilateral primary osteoarthritis, left knee: Secondary | ICD-10-CM | POA: Diagnosis not present

## 2023-10-31 LAB — RAD ONC ARIA SESSION SUMMARY
Course Elapsed Days: 17
Plan Fractions Treated to Date: 6
Plan Prescribed Dose Per Fraction: 0.5 Gy
Plan Total Fractions Prescribed: 6
Plan Total Prescribed Dose: 3 Gy
Reference Point Dosage Given to Date: 3 Gy
Reference Point Session Dosage Given: 0.5 Gy
Session Number: 6

## 2023-11-01 NOTE — Radiation Completion Notes (Signed)
 Patient Name: Yvette, [name] MRN: [id number] Date of Birth: [birth year] Referring Physician: MAUDE HERALD, M.D. Date of Service: 2023-11-01 Radiation Oncologist: Marcey Penton, M.D. 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  She has been treated with numerous injections over the years receiving her last injection of steroids back in July 2024.  She recently has noted increased pain and swelling the left knee and she has had multiple aspirations as well as steroid knee injections.  She continues to have mild swelling of the left knee.  She is seen today for consultation regarding radiation therapy of her osteoarthritis.      ==========DELIVERED PLANS==========  First Treatment Date: 2023-10-14 Last Treatment Date: 2023-10-31   Plan Name: Ext_L Site: Knee, Left Technique: Isodose Plan Mode: Photon Dose Per Fraction: 0.5 Gy Prescribed Dose (Delivered / Prescribed): 3 Gy / 3 Gy Prescribed Fxs (Delivered / Prescribed): 6 / 6     ==========ON TREATMENT VISIT DATES========== 2023-10-22, 2023-10-29     ==========UPCOMING VISITS========== 12/05/2023 [facility] RAD ONCOLOGY FOLLOW UP 30 Penton Marcey, MD  11/26/2023 [facility] PLASTIC SURG SPEC FOLLOW UP Dillingham, Estefana RAMAN, DO        ==========APPENDIX - ON TREATMENT VISIT NOTES==========   See weekly On Treatment Notes in Epic for details in the Media tab (listed as Progress notes on the On Treatment Visit Dates listed above).

## 2023-11-26 ENCOUNTER — Ambulatory Visit: Admitting: Plastic Surgery

## 2023-12-05 ENCOUNTER — Ambulatory Visit
Admission: RE | Admit: 2023-12-05 | Discharge: 2023-12-05 | Disposition: A | Discharge status: Home / Self Care | Source: Ambulatory Visit | Attending: Radiation Oncology | Admitting: Radiation Oncology

## 2023-12-05 DIAGNOSIS — M1712 Unilateral primary osteoarthritis, left knee: Secondary | ICD-10-CM | POA: Insufficient documentation

## 2023-12-05 NOTE — Progress Notes (Signed)
 Radiation Oncology Follow up Note  Name: [name]   Date:   12/05/2023 MRN:  [id number] DOB: 04-04-1961    This 63 y.o. female presents to the clinic today for 1 month follow-up status post low-dose radiation therapy for osteoarthritis of the left knee.  REFERRING PROVIDER: [facility]  HPI: Patient is a 63 year old female now at 1 month having completed low-dose radiation therapy for osteoarthritis of the left knee.  She is seen today in routine follow-up no side effects reported she has had almost complete pain response from a left level 5 pain to 0.  She does have a meniscus tear which was demonstrated on her MRI scan and that has been aspirated recently there is only some minimal swelling in that area at this time..  COMPLICATIONS OF TREATMENT: none  FOLLOW UP COMPLIANCE: keeps appointments   PHYSICAL EXAM:  There were no vitals taken for this visit. Excellent range of motion of the left knee without eliciting pain.  No significant overt swelling noted.  No skin discoloration.  RADIOLOGY RESULTS: No current films for review  PLAN: Present time patient sent complete response as far as pain is concerned from her left knee osteoarthritis.  She states she is dancing again and is happy with the result.  I have asked to see her back in 6 months for follow-up.  Patient knows to call with any concerns.  I would like to take this opportunity to thank you for allowing me to participate in the care of your patient.SABRA Marcey Penton, MD

## 2023-12-25 ENCOUNTER — Other Ambulatory Visit (HOSPITAL_COMMUNITY): Payer: Self-pay

## 2023-12-25 MED ORDER — TRAZODONE HCL 50 MG PO TABS
50.0000 mg | ORAL_TABLET | Freq: Every evening | ORAL | 0 refills | Status: DC | PRN
  Filled 2023-12-25 – 2024-01-08 (×3): qty 270, 90d supply, fill #0

## 2024-01-01 ENCOUNTER — Other Ambulatory Visit (HOSPITAL_COMMUNITY): Payer: Self-pay

## 2024-01-01 ENCOUNTER — Encounter: Payer: Self-pay | Admitting: Pharmacist

## 2024-01-01 ENCOUNTER — Other Ambulatory Visit: Payer: Self-pay

## 2024-01-06 ENCOUNTER — Other Ambulatory Visit: Payer: Self-pay

## 2024-01-08 ENCOUNTER — Other Ambulatory Visit (HOSPITAL_COMMUNITY): Payer: Self-pay

## 2024-01-08 ENCOUNTER — Other Ambulatory Visit: Payer: Self-pay

## 2024-01-08 MED ORDER — DULOXETINE HCL 30 MG PO CPEP
30.0000 mg | ORAL_CAPSULE | Freq: Two times a day (BID) | ORAL | 0 refills | Status: DC
  Filled 2024-01-08 (×2): qty 180, 90d supply, fill #0

## 2024-01-09 ENCOUNTER — Other Ambulatory Visit: Payer: Self-pay

## 2024-01-15 ENCOUNTER — Other Ambulatory Visit: Payer: Self-pay | Admitting: Internal Medicine

## 2024-01-15 ENCOUNTER — Other Ambulatory Visit (HOSPITAL_COMMUNITY): Payer: Self-pay

## 2024-01-15 ENCOUNTER — Ambulatory Visit
Admission: RE | Admit: 2024-01-15 | Discharge: 2024-01-15 | Disposition: A | Discharge status: Home / Self Care | Source: Ambulatory Visit | Attending: Internal Medicine | Admitting: Internal Medicine

## 2024-01-15 DIAGNOSIS — R052 Subacute cough: Secondary | ICD-10-CM

## 2024-01-15 MED ORDER — CYCLOBENZAPRINE HCL 10 MG PO TABS
10.0000 mg | ORAL_TABLET | Freq: Three times a day (TID) | ORAL | 1 refills | Status: AC
  Filled 2024-01-15: qty 30, 10d supply, fill #0
  Filled 2024-05-08: qty 30, 10d supply, fill #1

## 2024-01-15 MED ORDER — METRONIDAZOLE 500 MG PO TABS
ORAL_TABLET | ORAL | 0 refills | Status: AC
  Filled 2024-01-15: qty 14, 7d supply, fill #0

## 2024-01-17 ENCOUNTER — Other Ambulatory Visit (HOSPITAL_COMMUNITY): Payer: Self-pay

## 2024-01-17 MED ORDER — DOXYCYCLINE MONOHYDRATE 100 MG PO TABS
100.0000 mg | ORAL_TABLET | Freq: Two times a day (BID) | ORAL | 0 refills | Status: AC
  Filled 2024-01-17: qty 14, 7d supply, fill #0

## 2024-01-17 MED ORDER — HYDROCODONE BIT-HOMATROP MBR 5-1.5 MG/5ML PO SOLN
5.0000 mL | Freq: Two times a day (BID) | ORAL | 0 refills | Status: AC | PRN
  Filled 2024-01-17: qty 70, 7d supply, fill #0

## 2024-01-28 ENCOUNTER — Ambulatory Visit: Admitting: Plastic Surgery

## 2024-01-30 ENCOUNTER — Other Ambulatory Visit: Payer: Self-pay

## 2024-01-31 ENCOUNTER — Other Ambulatory Visit (HOSPITAL_COMMUNITY): Payer: Self-pay

## 2024-01-31 MED ORDER — ALBUTEROL SULFATE HFA 108 (90 BASE) MCG/ACT IN AERS
INHALATION_SPRAY | RESPIRATORY_TRACT | 0 refills | Status: AC
  Filled 2024-01-31: qty 6.7, 30d supply, fill #0

## 2024-01-31 MED ORDER — PREDNISONE 10 MG (21) PO TBPK
ORAL_TABLET | ORAL | 0 refills | Status: AC
  Filled 2024-01-31: qty 21, 6d supply, fill #0

## 2024-02-05 ENCOUNTER — Other Ambulatory Visit (HOSPITAL_BASED_OUTPATIENT_CLINIC_OR_DEPARTMENT_OTHER): Payer: Self-pay

## 2024-02-05 MED ORDER — FLUZONE 0.5 ML IM SUSY
0.5000 mL | PREFILLED_SYRINGE | Freq: Once | INTRAMUSCULAR | 0 refills | Status: AC
  Filled 2024-02-05: qty 0.5, 1d supply, fill #0

## 2024-02-06 ENCOUNTER — Other Ambulatory Visit (HOSPITAL_COMMUNITY): Payer: Self-pay

## 2024-02-06 MED ORDER — MESALAMINE 1000 MG RE SUPP
1000.0000 mg | Freq: Every day | RECTAL | 4 refills | Status: AC
  Filled 2024-02-06: qty 90, 90d supply, fill #0

## 2024-02-06 MED ORDER — MESALAMINE 1.2 G PO TBEC
2.4000 g | DELAYED_RELEASE_TABLET | Freq: Two times a day (BID) | ORAL | 4 refills | Status: AC
  Filled 2024-02-06: qty 360, 90d supply, fill #0

## 2024-02-07 ENCOUNTER — Other Ambulatory Visit: Payer: Self-pay

## 2024-02-07 ENCOUNTER — Other Ambulatory Visit (HOSPITAL_COMMUNITY): Payer: Self-pay

## 2024-02-12 ENCOUNTER — Other Ambulatory Visit (HOSPITAL_COMMUNITY): Payer: Self-pay

## 2024-02-13 ENCOUNTER — Other Ambulatory Visit (HOSPITAL_COMMUNITY): Payer: Self-pay

## 2024-02-18 ENCOUNTER — Ambulatory Visit (INDEPENDENT_AMBULATORY_CARE_PROVIDER_SITE_OTHER): Payer: Self-pay | Admitting: Plastic Surgery

## 2024-02-18 DIAGNOSIS — Z719 Counseling, unspecified: Secondary | ICD-10-CM

## 2024-02-18 NOTE — Progress Notes (Signed)
 The patient is a 63 year old female here for follow-up after TRL laser on her face.  We did not several spots.  The patient said she does not think there was a very big difference.  She is willing to try it again.  She is gena let me know when she has some downtime so we can do it.  I also recommend the BBL for her.

## 2024-02-25 ENCOUNTER — Other Ambulatory Visit (HOSPITAL_BASED_OUTPATIENT_CLINIC_OR_DEPARTMENT_OTHER): Payer: Self-pay

## 2024-02-26 ENCOUNTER — Other Ambulatory Visit: Payer: Self-pay

## 2024-03-02 ENCOUNTER — Other Ambulatory Visit (HOSPITAL_COMMUNITY): Payer: Self-pay

## 2024-03-04 ENCOUNTER — Other Ambulatory Visit: Payer: Self-pay

## 2024-03-04 ENCOUNTER — Other Ambulatory Visit (HOSPITAL_COMMUNITY): Payer: Self-pay

## 2024-03-04 MED ORDER — ESTRADIOL 0.05 MG/24HR TD PTTW
1.0000 | MEDICATED_PATCH | TRANSDERMAL | 1 refills | Status: AC
  Filled 2024-03-04: qty 24, 84d supply, fill #0
  Filled 2024-05-08: qty 24, 84d supply, fill #1

## 2024-04-01 ENCOUNTER — Other Ambulatory Visit (HOSPITAL_COMMUNITY): Payer: Self-pay

## 2024-04-01 ENCOUNTER — Other Ambulatory Visit: Payer: Self-pay

## 2024-04-01 MED ORDER — OMEPRAZOLE 20 MG PO CPDR
20.0000 mg | DELAYED_RELEASE_CAPSULE | Freq: Every day | ORAL | 0 refills | Status: AC
  Filled 2024-04-01: qty 14, 14d supply, fill #0

## 2024-04-02 ENCOUNTER — Other Ambulatory Visit: Payer: Self-pay

## 2024-04-03 ENCOUNTER — Other Ambulatory Visit: Payer: Self-pay

## 2024-04-07 ENCOUNTER — Other Ambulatory Visit (HOSPITAL_COMMUNITY): Payer: Self-pay

## 2024-04-08 ENCOUNTER — Other Ambulatory Visit (HOSPITAL_COMMUNITY): Payer: Self-pay

## 2024-04-08 MED ORDER — TRETINOIN 0.1 % EX CREA
TOPICAL_CREAM | CUTANEOUS | 0 refills | Status: AC
  Filled 2024-04-08: qty 90, 90d supply, fill #0

## 2024-04-09 ENCOUNTER — Other Ambulatory Visit: Payer: Self-pay

## 2024-04-11 ENCOUNTER — Other Ambulatory Visit (HOSPITAL_COMMUNITY): Payer: Self-pay

## 2024-04-11 MED ORDER — DULOXETINE HCL 30 MG PO CPEP
30.0000 mg | ORAL_CAPSULE | Freq: Two times a day (BID) | ORAL | 3 refills | Status: AC
  Filled 2024-04-11: qty 180, 90d supply, fill #0
  Filled 2024-05-08: qty 180, 90d supply, fill #1

## 2024-04-12 ENCOUNTER — Other Ambulatory Visit (HOSPITAL_COMMUNITY): Payer: Self-pay

## 2024-04-13 ENCOUNTER — Other Ambulatory Visit: Payer: Self-pay

## 2024-05-08 ENCOUNTER — Other Ambulatory Visit (HOSPITAL_BASED_OUTPATIENT_CLINIC_OR_DEPARTMENT_OTHER): Payer: Self-pay

## 2024-05-08 ENCOUNTER — Other Ambulatory Visit (HOSPITAL_COMMUNITY): Payer: Self-pay

## 2024-05-08 ENCOUNTER — Other Ambulatory Visit: Payer: Self-pay

## 2024-05-10 ENCOUNTER — Other Ambulatory Visit: Payer: Self-pay | Admitting: Medical Genetics

## 2024-05-11 ENCOUNTER — Other Ambulatory Visit: Payer: Self-pay

## 2024-05-12 ENCOUNTER — Other Ambulatory Visit: Payer: Self-pay | Admitting: Medical Genetics

## 2024-05-12 DIAGNOSIS — Z006 Encounter for examination for normal comparison and control in clinical research program: Secondary | ICD-10-CM

## 2024-05-14 ENCOUNTER — Other Ambulatory Visit (HOSPITAL_COMMUNITY): Payer: Self-pay

## 2024-05-15 ENCOUNTER — Other Ambulatory Visit (HOSPITAL_COMMUNITY): Payer: Self-pay

## 2024-05-15 ENCOUNTER — Other Ambulatory Visit: Payer: Self-pay

## 2024-05-15 MED ORDER — TRAZODONE HCL 50 MG PO TABS
50.0000 mg | ORAL_TABLET | Freq: Every evening | ORAL | 1 refills | Status: AC | PRN
  Filled 2024-05-15: qty 270, 90d supply, fill #0

## 2024-05-17 ENCOUNTER — Other Ambulatory Visit (HOSPITAL_COMMUNITY): Payer: Self-pay

## 2024-05-25 ENCOUNTER — Telehealth: Payer: Self-pay | Admitting: Medical Genetics

## 2024-05-25 ENCOUNTER — Other Ambulatory Visit: Payer: Self-pay | Admitting: Medical Genetics

## 2024-05-25 DIAGNOSIS — Z006 Encounter for examination for normal comparison and control in clinical research program: Secondary | ICD-10-CM

## 2024-05-25 LAB — GENECONNECT MOLECULAR SCREEN

## 2024-05-25 NOTE — Telephone Encounter (Signed)
 [facility]  05/25/2024 3:12 PM  Confirmed I was speaking with [name] [id number] by using name and DOB. Informed participant the reason for this call is to follow-up on a recent sample the participant provided at one of the [facility] lab locations. Informed participant the test was not able to be completed with this sample and apologized for the inconvenience. Participant was requested to provide a new sample at one of our participating labs at no cost so that participant can continue participation and receive test results. Informed participant they do not need to be fasting and if there are other samples that need to be drawn, they can be done at the same visit. Participant has not had a blood transfusion or blood product in the last 30 days. Participant agreed to provide another sample. Participant was provided the [employer] program website to learn why this may have happened. Participant was thanked for their time and continued support of the above study.    Jordyn Pennstrom, BS [place]   [facility] Clinical Research Specialist II Direct Dial: [phone]  Fax: [phone]

## 2024-05-28 ENCOUNTER — Other Ambulatory Visit (HOSPITAL_COMMUNITY): Admission: RE | Admit: 2024-05-28 | Discharge: 2024-05-28 | Attending: Medical Genetics | Admitting: Medical Genetics

## 2024-05-28 DIAGNOSIS — Z006 Encounter for examination for normal comparison and control in clinical research program: Secondary | ICD-10-CM

## 2024-05-29 LAB — VARICELLA ZOSTER ANTIBODY, IGG: Varicella IgG: REACTIVE

## 2024-06-17 ENCOUNTER — Ambulatory Visit: Admitting: Pulmonary Disease
